# Patient Record
Sex: Female | Born: 1940 | Race: White | Hispanic: No | Smoking: Current every day smoker
Health system: Southern US, Community
[De-identification: ages and names within clinical notes are randomized; demographics above are authoritative.]

## PROBLEM LIST (undated history)

## (undated) DIAGNOSIS — I2510 Atherosclerotic heart disease of native coronary artery without angina pectoris: Secondary | ICD-10-CM

## (undated) DIAGNOSIS — L0390 Cellulitis, unspecified: Secondary | ICD-10-CM

## (undated) DIAGNOSIS — I509 Heart failure, unspecified: Secondary | ICD-10-CM

## (undated) DIAGNOSIS — F4310 Post-traumatic stress disorder, unspecified: Secondary | ICD-10-CM

## (undated) DIAGNOSIS — K279 Peptic ulcer, site unspecified, unspecified as acute or chronic, without hemorrhage or perforation: Secondary | ICD-10-CM

## (undated) DIAGNOSIS — G43909 Migraine, unspecified, not intractable, without status migrainosus: Secondary | ICD-10-CM

## (undated) DIAGNOSIS — F419 Anxiety disorder, unspecified: Secondary | ICD-10-CM

## (undated) DIAGNOSIS — J45909 Unspecified asthma, uncomplicated: Secondary | ICD-10-CM

## (undated) DIAGNOSIS — M069 Rheumatoid arthritis, unspecified: Secondary | ICD-10-CM

## (undated) DIAGNOSIS — I639 Cerebral infarction, unspecified: Secondary | ICD-10-CM

## (undated) DIAGNOSIS — F0390 Unspecified dementia without behavioral disturbance: Secondary | ICD-10-CM

## (undated) DIAGNOSIS — F319 Bipolar disorder, unspecified: Secondary | ICD-10-CM

## (undated) DIAGNOSIS — I10 Essential (primary) hypertension: Secondary | ICD-10-CM

## (undated) HISTORY — DX: Unspecified asthma, uncomplicated: J45.909

## (undated) HISTORY — PX: TONSILLECTOMY: SUR1361

## (undated) HISTORY — PX: BREAST SURGERY: SHX581

## (undated) HISTORY — PX: BREAST ENHANCEMENT SURGERY: SHX7

## (undated) HISTORY — DX: Unspecified dementia, unspecified severity, without behavioral disturbance, psychotic disturbance, mood disturbance, and anxiety: F03.90

## (undated) HISTORY — DX: Cellulitis, unspecified: L03.90

## (undated) HISTORY — DX: Peptic ulcer, site unspecified, unspecified as acute or chronic, without hemorrhage or perforation: K27.9

## (undated) HISTORY — DX: Migraine, unspecified, not intractable, without status migrainosus: G43.909

## (undated) HISTORY — PX: CHOLECYSTECTOMY: SHX55

## (undated) HISTORY — PX: TUBAL LIGATION: SHX77

---

## 2013-07-29 ENCOUNTER — Emergency Department (HOSPITAL_BASED_OUTPATIENT_CLINIC_OR_DEPARTMENT_OTHER)
Admission: EM | Admit: 2013-07-29 | Discharge: 2013-07-29 | Disposition: A | Discharge status: Home / Self Care | Payer: Medicare Other | Attending: Emergency Medicine | Admitting: Emergency Medicine

## 2013-07-29 ENCOUNTER — Emergency Department (HOSPITAL_BASED_OUTPATIENT_CLINIC_OR_DEPARTMENT_OTHER): Payer: Medicare Other

## 2013-07-29 ENCOUNTER — Encounter (HOSPITAL_BASED_OUTPATIENT_CLINIC_OR_DEPARTMENT_OTHER): Payer: Self-pay | Admitting: Emergency Medicine

## 2013-07-29 DIAGNOSIS — I2510 Atherosclerotic heart disease of native coronary artery without angina pectoris: Secondary | ICD-10-CM | POA: Insufficient documentation

## 2013-07-29 DIAGNOSIS — M069 Rheumatoid arthritis, unspecified: Secondary | ICD-10-CM | POA: Insufficient documentation

## 2013-07-29 DIAGNOSIS — Z7902 Long term (current) use of antithrombotics/antiplatelets: Secondary | ICD-10-CM | POA: Insufficient documentation

## 2013-07-29 DIAGNOSIS — Z79899 Other long term (current) drug therapy: Secondary | ICD-10-CM | POA: Insufficient documentation

## 2013-07-29 DIAGNOSIS — Z7982 Long term (current) use of aspirin: Secondary | ICD-10-CM | POA: Insufficient documentation

## 2013-07-29 DIAGNOSIS — R600 Localized edema: Secondary | ICD-10-CM

## 2013-07-29 DIAGNOSIS — Z8673 Personal history of transient ischemic attack (TIA), and cerebral infarction without residual deficits: Secondary | ICD-10-CM | POA: Insufficient documentation

## 2013-07-29 DIAGNOSIS — F17200 Nicotine dependence, unspecified, uncomplicated: Secondary | ICD-10-CM | POA: Insufficient documentation

## 2013-07-29 DIAGNOSIS — I10 Essential (primary) hypertension: Secondary | ICD-10-CM | POA: Insufficient documentation

## 2013-07-29 DIAGNOSIS — R609 Edema, unspecified: Secondary | ICD-10-CM | POA: Insufficient documentation

## 2013-07-29 DIAGNOSIS — I509 Heart failure, unspecified: Secondary | ICD-10-CM | POA: Insufficient documentation

## 2013-07-29 DIAGNOSIS — IMO0002 Reserved for concepts with insufficient information to code with codable children: Secondary | ICD-10-CM | POA: Insufficient documentation

## 2013-07-29 HISTORY — DX: Heart failure, unspecified: I50.9

## 2013-07-29 HISTORY — DX: Rheumatoid arthritis, unspecified: M06.9

## 2013-07-29 HISTORY — DX: Essential (primary) hypertension: I10

## 2013-07-29 HISTORY — DX: Cerebral infarction, unspecified: I63.9

## 2013-07-29 HISTORY — DX: Atherosclerotic heart disease of native coronary artery without angina pectoris: I25.10

## 2013-07-29 LAB — COMPREHENSIVE METABOLIC PANEL
ALBUMIN: 3.8 g/dL (ref 3.5–5.2)
ALK PHOS: 121 U/L — AB (ref 39–117)
ALT: 43 U/L — ABNORMAL HIGH (ref 0–35)
AST: 39 U/L — ABNORMAL HIGH (ref 0–37)
BUN: 10 mg/dL (ref 6–23)
CO2: 29 mEq/L (ref 19–32)
Calcium: 9.4 mg/dL (ref 8.4–10.5)
Chloride: 98 mEq/L (ref 96–112)
Creatinine, Ser: 0.5 mg/dL (ref 0.50–1.10)
GFR calc non Af Amer: >90 mL/min (ref >90)
GLUCOSE: 209 mg/dL — AB (ref 70–99)
POTASSIUM: 4.7 meq/L (ref 3.7–5.3)
SODIUM: 139 meq/L (ref 137–147)
Total Bilirubin: 0.3 mg/dL (ref 0.3–1.2)
Total Protein: 6.5 g/dL (ref 6.0–8.3)

## 2013-07-29 LAB — TROPONIN I: Troponin I: <0.3 ng/mL (ref <0.30)

## 2013-07-29 LAB — CBC
HCT: 35.9 % — ABNORMAL LOW (ref 36.0–46.0)
HEMOGLOBIN: 12 g/dL (ref 12.0–15.0)
MCH: 28.8 pg (ref 26.0–34.0)
MCHC: 33.4 g/dL (ref 30.0–36.0)
MCV: 86.1 fL (ref 78.0–100.0)
Platelets: 203 10*3/uL (ref 150–400)
RBC: 4.17 MIL/uL (ref 3.87–5.11)
RDW: 15.3 % (ref 11.5–15.5)
WBC: 8.8 10*3/uL (ref 4.0–10.5)

## 2013-07-29 LAB — PRO B NATRIURETIC PEPTIDE: Pro B Natriuretic peptide (BNP): 85.9 pg/mL (ref 0–125)

## 2013-07-29 MED ORDER — POTASSIUM CHLORIDE CRYS ER 20 MEQ PO TBCR: 20.0000 meq | EXTENDED_RELEASE_TABLET | Freq: Every day | ORAL | Status: DC

## 2013-07-29 MED ORDER — HYDROCODONE-ACETAMINOPHEN 5-325 MG PO TABS: 2.0000 | ORAL_TABLET | ORAL | Status: DC | PRN

## 2013-07-29 MED ORDER — ONDANSETRON HCL 4 MG/2ML IJ SOLN
4.0000 mg | Freq: Once | INTRAMUSCULAR | Status: AC
  Administered 2013-07-29: 4 mg via INTRAVENOUS

## 2013-07-29 MED ORDER — FUROSEMIDE 10 MG/ML IJ SOLN
40.0000 mg | Freq: Once | INTRAMUSCULAR | Status: AC
  Administered 2013-07-29: 40 mg via INTRAVENOUS
  Filled 2013-07-29: qty 4

## 2013-07-29 MED ORDER — ONDANSETRON HCL 4 MG/2ML IJ SOLN: 4.0000 mg | Freq: Once | INTRAMUSCULAR | Status: DC

## 2013-07-29 MED ORDER — MORPHINE SULFATE 4 MG/ML IJ SOLN
4.0000 mg | Freq: Once | INTRAMUSCULAR | Status: AC
  Administered 2013-07-29: 4 mg via INTRAVENOUS
  Filled 2013-07-29: qty 1

## 2013-07-29 MED ORDER — ONDANSETRON HCL 4 MG/2ML IJ SOLN
INTRAMUSCULAR | Status: AC
  Administered 2013-07-29: 4 mg via INTRAVENOUS
  Filled 2013-07-29: qty 2

## 2013-07-29 MED ORDER — FUROSEMIDE 20 MG PO TABS: 20.0000 mg | ORAL_TABLET | Freq: Every day | ORAL | Status: DC

## 2013-07-29 NOTE — Discharge Instructions (Signed)
Lasix and potassium as prescribed.  Hydrocodone as prescribed as needed for pain.  Followup with your primary Dr. in one week to be rechecked, and return to the ER if your symptoms substantially worsen or change.   Peripheral Edema You have swelling in your legs (peripheral edema). This swelling is due to excess accumulation of salt and water in your body. Edema may be a sign of heart, kidney or liver disease, or a side effect of a medication. It may also be due to problems in the leg veins. Elevating your legs and using special support stockings may be very helpful, if the cause of the swelling is due to poor venous circulation. Avoid long periods of standing, whatever the cause. Treatment of edema depends on identifying the cause. Chips, pretzels, pickles and other salty foods should be avoided. Restricting salt in your diet is almost always needed. Water pills (diuretics) are often used to remove the excess salt and water from your body via urine. These medicines prevent the kidney from reabsorbing sodium. This increases urine flow. Diuretic treatment may also result in lowering of potassium levels in your body. Potassium supplements may be needed if you have to use diuretics daily. Daily weights can help you keep track of your progress in clearing your edema. You should call your caregiver for follow up care as recommended. SEEK IMMEDIATE MEDICAL CARE IF:   You have increased swelling, pain, redness, or heat in your legs.  You develop shortness of breath, especially when lying down.  You develop chest or abdominal pain, weakness, or fainting.  You have a fever. Document Released: 03/21/2004 Document Revised: 05/06/2011 Document Reviewed: 03/01/2009 [facility] Patient Information 2014 [place], [place].

## 2013-07-29 NOTE — ED Notes (Signed)
MD at bedside. 

## 2013-07-29 NOTE — ED Provider Notes (Signed)
CSN: [id number]     Arrival date & time 07/29/13  1243 History   First MD Initiated Contact with Patient 07/29/13 1326     Chief Complaint  Patient presents with  . Leg Swelling     (Consider location/radiation/quality/duration/timing/severity/associated sxs/prior Treatment) HPI Comments: Patient is a 73 year old female with history of hypertension, CHF. She presents today with complaints of increased leg swelling. She states she's been having discomfort in her legs it is making it difficult for her to walk. She has had episodes of shortness of breath but denies this at present. She denies any chest pain. She denies taking any diuretics. She was seen last week by her primary Dr. who felt that she had cellulitis. She had received multiple doses of IV antibiotics. Her redness has improved, however the swelling persists.  The history is provided by the patient.    Past Medical History  Diagnosis Date  . Rheumatoid arthritis   . CHF (congestive heart failure)   . Hypertension   . Stroke   . Coronary artery disease    Past Surgical History  Procedure Laterality Date  . Tubal ligation    . Cholecystectomy    . Breast surgery     No family history on file. History  Substance Use Topics  . Smoking status: Current Every Day Smoker  . Smokeless tobacco: Not on file  . Alcohol Use: Not on file   OB History   Grav Para Term Preterm Abortions TAB SAB Ect Mult Living                 Review of Systems  All other systems reviewed and are negative.     Allergies  Shellfish allergy and Vesicare  Home Medications   Prior to Admission medications   Medication Sig Start Date End Date Taking? Authorizing Provider  ALPRAZolam Prudy Feeler) 0.5 MG tablet Take 0.5 mg by mouth 2 (two) times daily.   Yes Historical Provider, MD  aspirin 81 MG tablet Take 81 mg by mouth daily.   Yes Historical Provider, MD  carvedilol (COREG) 25 MG tablet Take 25 mg by mouth 2 (two) times daily with a meal.    Yes Historical Provider, MD  clopidogrel (PLAVIX) 75 MG tablet Take 75 mg by mouth daily with breakfast.   Yes Historical Provider, MD  gabapentin (NEURONTIN) 100 MG capsule Take 100 mg by mouth 3 (three) times daily.   Yes Historical Provider, MD  ibuprofen (ADVIL,MOTRIN) 800 MG tablet Take 800 mg by mouth every 8 (eight) hours as needed.   Yes Historical Provider, MD  mirabegron ER (MYRBETRIQ) 50 MG TB24 tablet Take 50 mg by mouth daily.   Yes Historical Provider, MD  Multiple Vitamin (MULTIVITAMIN) tablet Take 1 tablet by mouth daily.   Yes Historical Provider, MD  prednisoLONE 5 MG TABS tablet Take 5 mg by mouth daily.   Yes Historical Provider, MD  traZODone (DESYREL) 50 MG tablet Take 50 mg by mouth at bedtime.   Yes Historical Provider, MD   BP 126/61  Pulse 67  Temp(Src) 98.3 F (36.8 C) (Oral)  Resp 16  Ht 5\' 1"  (1.549 m)  Wt 164 lb (74.39 kg)  BMI 31.00 kg/m2  SpO2 97% Physical Exam  Nursing note and vitals reviewed. Constitutional: She is oriented to person, place, and time. She appears well-developed and well-nourished. No distress.  HENT:  Head: Normocephalic and atraumatic.  Neck: Normal range of motion. Neck supple.  Cardiovascular: Normal rate and regular rhythm.  Exam  reveals no gallop and no friction rub.   No murmur heard. Pulmonary/Chest: Effort normal and breath sounds normal. No respiratory distress. She has no wheezes.  Abdominal: Soft. Bowel sounds are normal. She exhibits no distension. There is no tenderness.  Musculoskeletal: Normal range of motion. She exhibits edema.   There is 3+ pitting edema in the bilateral lower extremities in a stocking distribution.  Neurological: She is alert and oriented to person, place, and time.  Skin: Skin is warm and dry. She is not diaphoretic.    ED Course  Procedures (including critical care time) Labs Review Labs Reviewed  CBC  COMPREHENSIVE METABOLIC PANEL  TROPONIN I  PRO B NATRIURETIC PEPTIDE    Imaging  Review No results found.   EKG Interpretation   Date/Time:  Thursday July 29 2013 13:42:25 EDT Ventricular Rate:  68 PR Interval:  186 QRS Duration: 74 QT Interval:  416 QTC Calculation: 442 R Axis:   3 Text Interpretation:  Normal sinus rhythm Normal ECG Confirmed by DELOS   MD, Pauline Pegues ([id number]) on 07/29/2013 2:59:59 PM      MDM   Final diagnoses:  None    Patient is a 73 year old female with history of hypertension, CHF, and rheumatoid arthritis. She presents with complaints of swelling of both legs. She had a recent cellulitis which was treated with antibiotics. The redness has improved, however her swelling persists. Workup reveals a normal BNP and clear chest x-ray. This does not appear to be CHF. Her renal function is normal. She was given IV Lasix with a good diuresis. I feel as though she is appropriate for discharge. She will be treated with Lasix and when necessary followup. She is also requested something for discomfort and I will write her for some hydrocodone.    Geoffery Lyons, MD 07/29/13 [id number]

## 2013-07-29 NOTE — ED Notes (Signed)
Pt c/o bilateral leg swelling and pain.  Seen by provider last Saturday and has been treated for infection to lower legs.

## 2013-08-09 ENCOUNTER — Emergency Department (HOSPITAL_BASED_OUTPATIENT_CLINIC_OR_DEPARTMENT_OTHER): Payer: Medicare Other

## 2013-08-09 ENCOUNTER — Emergency Department (HOSPITAL_BASED_OUTPATIENT_CLINIC_OR_DEPARTMENT_OTHER)
Admission: EM | Admit: 2013-08-09 | Discharge: 2013-08-09 | Disposition: A | Discharge status: Home / Self Care | Payer: Medicare Other | Attending: Emergency Medicine | Admitting: Emergency Medicine

## 2013-08-09 ENCOUNTER — Encounter (HOSPITAL_BASED_OUTPATIENT_CLINIC_OR_DEPARTMENT_OTHER): Payer: Self-pay | Admitting: Emergency Medicine

## 2013-08-09 DIAGNOSIS — F411 Generalized anxiety disorder: Secondary | ICD-10-CM | POA: Insufficient documentation

## 2013-08-09 DIAGNOSIS — Z7982 Long term (current) use of aspirin: Secondary | ICD-10-CM | POA: Insufficient documentation

## 2013-08-09 DIAGNOSIS — M069 Rheumatoid arthritis, unspecified: Secondary | ICD-10-CM | POA: Insufficient documentation

## 2013-08-09 DIAGNOSIS — Z8673 Personal history of transient ischemic attack (TIA), and cerebral infarction without residual deficits: Secondary | ICD-10-CM | POA: Insufficient documentation

## 2013-08-09 DIAGNOSIS — I2510 Atherosclerotic heart disease of native coronary artery without angina pectoris: Secondary | ICD-10-CM | POA: Insufficient documentation

## 2013-08-09 DIAGNOSIS — Z79899 Other long term (current) drug therapy: Secondary | ICD-10-CM | POA: Insufficient documentation

## 2013-08-09 DIAGNOSIS — I509 Heart failure, unspecified: Secondary | ICD-10-CM | POA: Insufficient documentation

## 2013-08-09 DIAGNOSIS — Z7902 Long term (current) use of antithrombotics/antiplatelets: Secondary | ICD-10-CM | POA: Insufficient documentation

## 2013-08-09 DIAGNOSIS — R0602 Shortness of breath: Secondary | ICD-10-CM | POA: Insufficient documentation

## 2013-08-09 DIAGNOSIS — IMO0002 Reserved for concepts with insufficient information to code with codable children: Secondary | ICD-10-CM | POA: Insufficient documentation

## 2013-08-09 DIAGNOSIS — R609 Edema, unspecified: Secondary | ICD-10-CM | POA: Insufficient documentation

## 2013-08-09 DIAGNOSIS — R600 Localized edema: Secondary | ICD-10-CM

## 2013-08-09 DIAGNOSIS — F17200 Nicotine dependence, unspecified, uncomplicated: Secondary | ICD-10-CM | POA: Insufficient documentation

## 2013-08-09 HISTORY — DX: Anxiety disorder, unspecified: F41.9

## 2013-08-09 LAB — BASIC METABOLIC PANEL
BUN: 13 mg/dL (ref 6–23)
CALCIUM: 9.4 mg/dL (ref 8.4–10.5)
CHLORIDE: 98 meq/L (ref 96–112)
CO2: 29 mEq/L (ref 19–32)
CREATININE: 0.6 mg/dL (ref 0.50–1.10)
GFR calc non Af Amer: 88 mL/min — ABNORMAL LOW (ref >90)
Glucose, Bld: 118 mg/dL — ABNORMAL HIGH (ref 70–99)
Potassium: 4 mEq/L (ref 3.7–5.3)
Sodium: 138 mEq/L (ref 137–147)

## 2013-08-09 LAB — URINALYSIS, ROUTINE W REFLEX MICROSCOPIC
Bilirubin Urine: NEGATIVE
Glucose, UA: NEGATIVE mg/dL
HGB URINE DIPSTICK: NEGATIVE
KETONES UR: NEGATIVE mg/dL
Nitrite: NEGATIVE
Protein, ur: NEGATIVE mg/dL
Specific Gravity, Urine: 1.008 (ref 1.005–1.030)
UROBILINOGEN UA: 0.2 mg/dL (ref 0.0–1.0)
pH: 6 (ref 5.0–8.0)

## 2013-08-09 LAB — CBC WITH DIFFERENTIAL/PLATELET
Basophils Absolute: 0 10*3/uL (ref 0.0–0.1)
Basophils Relative: 0 % (ref 0–1)
EOS PCT: 1 % (ref 0–5)
Eosinophils Absolute: 0.1 10*3/uL (ref 0.0–0.7)
HCT: 35.3 % — ABNORMAL LOW (ref 36.0–46.0)
Hemoglobin: 11.7 g/dL — ABNORMAL LOW (ref 12.0–15.0)
LYMPHS ABS: 3.8 10*3/uL (ref 0.7–4.0)
Lymphocytes Relative: 31 % (ref 12–46)
MCH: 28.1 pg (ref 26.0–34.0)
MCHC: 33.1 g/dL (ref 30.0–36.0)
MCV: 84.9 fL (ref 78.0–100.0)
MONO ABS: 1.1 10*3/uL — AB (ref 0.1–1.0)
Monocytes Relative: 9 % (ref 3–12)
Neutro Abs: 7.3 10*3/uL (ref 1.7–7.7)
Neutrophils Relative %: 59 % (ref 43–77)
Platelets: 190 10*3/uL (ref 150–400)
RBC: 4.16 MIL/uL (ref 3.87–5.11)
RDW: 15.5 % (ref 11.5–15.5)
WBC: 12.3 10*3/uL — ABNORMAL HIGH (ref 4.0–10.5)

## 2013-08-09 LAB — D-DIMER, QUANTITATIVE: D-Dimer, Quant: 0.4 ug/mL-FEU (ref 0.00–0.48)

## 2013-08-09 LAB — URINE MICROSCOPIC-ADD ON

## 2013-08-09 LAB — PRO B NATRIURETIC PEPTIDE: Pro B Natriuretic peptide (BNP): 132.4 pg/mL — ABNORMAL HIGH (ref 0–125)

## 2013-08-09 MED ORDER — HYDROCODONE-ACETAMINOPHEN 5-325 MG PO TABS: 1.0000 | ORAL_TABLET | Freq: Four times a day (QID) | ORAL | Status: DC | PRN

## 2013-08-09 MED ORDER — FUROSEMIDE 20 MG PO TABS: 20.0000 mg | ORAL_TABLET | Freq: Two times a day (BID) | ORAL | Status: DC

## 2013-08-09 MED ORDER — FUROSEMIDE 10 MG/ML IJ SOLN
20.0000 mg | Freq: Once | INTRAMUSCULAR | Status: AC
  Administered 2013-08-09: 20 mg via INTRAVENOUS
  Filled 2013-08-09: qty 2

## 2013-08-09 MED ORDER — ONDANSETRON HCL 4 MG/2ML IJ SOLN
4.0000 mg | Freq: Once | INTRAMUSCULAR | Status: AC
  Administered 2013-08-09: 4 mg via INTRAVENOUS
  Filled 2013-08-09: qty 2

## 2013-08-09 MED ORDER — MORPHINE SULFATE 4 MG/ML IJ SOLN
4.0000 mg | Freq: Once | INTRAMUSCULAR | Status: AC
  Administered 2013-08-09: 4 mg via INTRAVENOUS
  Filled 2013-08-09: qty 1

## 2013-08-09 NOTE — Discharge Instructions (Signed)

## 2013-08-09 NOTE — ED Provider Notes (Signed)
CSN: [id number]     Arrival date & time 08/09/13  1455 History  This chart was scribed for Shon Baton, MD by Charline Bills, ED Scribe. The patient was seen in room MH04/MH04. Patient's care was started at 3:19 PM.   Chief Complaint  Patient presents with  . Leg Pain   The history is provided by the patient. No language interpreter was used.   HPI Comments: Joyce Mathews is a 73 y.o. female, with a h/o CHF, who presents to the Emergency Department complaining of constant leg pain, R greater than L. Pt rates her current pain 10/10. Pt reports walking 4 blocks to [employer] this morning before experiencing pain. She reports bilateral leg swelling that began after a bug bite 6 months ago.. Pt states that she was seen 2 weeks ago for same symptoms and given 325mg  Hydrocodone. She currently takes 1 water pill a day; unsure of the dosage.   She denies chest pain but does report occasional shortness of breath. Shortness of breath right now. Denies any coughs or fevers. Was also recently treated for cellulitis. Primary care physician is at [facility].  Past Medical History  Diagnosis Date  . Rheumatoid arthritis   . CHF (congestive heart failure)   . Hypertension   . Stroke   . Coronary artery disease   . Anxiety    Past Surgical History  Procedure Laterality Date  . Tubal ligation    . Cholecystectomy    . Breast surgery     History reviewed. No pertinent family history. History  Substance Use Topics  . Smoking status: Current Every Day Smoker  . Smokeless tobacco: Not on file  . Alcohol Use: Not on file   OB History   Grav Para Term Preterm Abortions TAB SAB Ect Mult Living                 Review of Systems  Constitutional: Negative for fever.  Respiratory: Positive for shortness of breath. Negative for cough and chest tightness.   Cardiovascular: Positive for leg swelling. Negative for chest pain and palpitations.  Gastrointestinal: Negative for nausea, vomiting and  abdominal pain.  Genitourinary: Negative for dysuria.  Musculoskeletal: Negative for back pain.       Bilateral leg pain  Neurological: Negative for headaches.  Psychiatric/Behavioral: Negative for confusion.  All other systems reviewed and are negative.   Allergies  Shellfish allergy and Vesicare  Home Medications   Prior to Admission medications   Medication Sig Start Date End Date Taking? Authorizing Provider  LORazepam (ATIVAN) 0.5 MG tablet Take 0.5 mg by mouth every 8 (eight) hours.   Yes Historical Provider, MD  ALPRAZolam ) 0.5 MG tablet Take 0.5 mg by mouth 2 (two) times daily.    Historical Provider, MD  aspirin 81 MG tablet Take 81 mg by mouth daily.    Historical Provider, MD  carvedilol (COREG) 25 MG tablet Take 25 mg by mouth 2 (two) times daily with a meal.    Historical Provider, MD  clopidogrel (PLAVIX) 75 MG tablet Take 75 mg by mouth daily with breakfast.    Historical Provider, MD  furosemide (LASIX) 20 MG tablet Take 1 tablet (20 mg total) by mouth 2 (two) times daily. 08/09/13   08/11/13, MD  gabapentin (NEURONTIN) 100 MG capsule Take 100 mg by mouth 3 (three) times daily.    Historical Provider, MD  HYDROcodone-acetaminophen (NORCO) 5-325 MG per tablet Take 2 tablets by mouth every 4 (four) hours  as needed. 07/29/13   Geoffery Lyons, MD  HYDROcodone-acetaminophen (NORCO/VICODIN) 5-325 MG per tablet Take 1 tablet by mouth every 6 (six) hours as needed. 08/09/13   Shon Baton, MD  ibuprofen (ADVIL,MOTRIN) 800 MG tablet Take 800 mg by mouth every 8 (eight) hours as needed.    Historical Provider, MD  mirabegron ER (MYRBETRIQ) 50 MG TB24 tablet Take 50 mg by mouth daily.    Historical Provider, MD  Multiple Vitamin (MULTIVITAMIN) tablet Take 1 tablet by mouth daily.    Historical Provider, MD  potassium chloride SA (K-DUR,KLOR-CON) 20 MEQ tablet Take 1 tablet (20 mEq total) by mouth daily. 07/29/13   Geoffery Lyons, MD  prednisoLONE 5 MG TABS tablet Take 5  mg by mouth daily.    Historical Provider, MD  traZODone (DESYREL) 50 MG tablet Take 50 mg by mouth at bedtime.    Historical Provider, MD   Triage Vitals: BP 134/65  Pulse 75  Temp(Src) 97.6 F (36.4 C) (Oral)  Resp 18  Ht 5\' 1"  (1.549 m)  Wt 164 lb (74.39 kg)  BMI 31.00 kg/m2  SpO2 98% Physical Exam  Nursing note and vitals reviewed. Constitutional: She is oriented to person, place, and time. She appears well-developed and well-nourished.  Anxious appearing  HENT:  Head: Normocephalic and atraumatic.  Eyes: Pupils are equal, round, and reactive to light.  Neck: Neck supple. No JVD present.  Cardiovascular: Normal rate, regular rhythm and normal heart sounds.   No murmur heard. Pulmonary/Chest: Effort normal and breath sounds normal. No respiratory distress. She has no wheezes.  Abdominal: Soft. Bowel sounds are normal. There is no tenderness. There is no rebound.  Musculoskeletal: She exhibits edema.  3+ symmetric bilateral lower extremity pitting edema, no overlying skin changes, erythema, or warmth  Neurological: She is alert and oriented to person, place, and time.  Skin: Skin is warm and dry.  Psychiatric: She has a normal mood and affect.    ED Course  Procedures (including critical care time) DIAGNOSTIC STUDIES: Oxygen Saturation is 98% on RA, normal by my interpretation.    COORDINATION OF CARE: 3:22 PM Discussed treatment plan with pt at bedside and pt agreed to plan.\  Labs Review Labs Reviewed  URINALYSIS, ROUTINE W REFLEX MICROSCOPIC - Abnormal; Notable for the following:    Leukocytes, UA MODERATE (*)    All other components within normal limits  CBC WITH DIFFERENTIAL - Abnormal; Notable for the following:    WBC 12.3 (*)    Hemoglobin 11.7 (*)    HCT 35.3 (*)    Monocytes Absolute 1.1 (*)    All other components within normal limits  PRO B NATRIURETIC PEPTIDE - Abnormal; Notable for the following:    Pro B Natriuretic peptide (BNP) 132.4 (*)    All  other components within normal limits  BASIC METABOLIC PANEL - Abnormal; Notable for the following:    Glucose, Bld 118 (*)    GFR calc non Af Amer 88 (*)    All other components within normal limits  URINE CULTURE  D-DIMER, QUANTITATIVE  URINE MICROSCOPIC-ADD ON  CBC WITH DIFFERENTIAL   Imaging Review Dg Chest 2 View  08/09/2013   CLINICAL DATA:  Leg pain.  EXAM: CHEST  2 VIEW  COMPARISON:  July 29, 2013.  FINDINGS: The heart size and mediastinal contours are within normal limits. Both lungs are clear. No pneumothorax or pleural effusion is noted. The visualized skeletal structures are unremarkable.  IMPRESSION: No acute cardiopulmonary abnormality seen.  Electronically Signed   By: Roque Lias M.D.   On: 08/09/2013 16:10    EKG Interpretation   Date/Time:  Monday August 09 2013 16:21:54 EDT Ventricular Rate:  64 PR Interval:  198 QRS Duration: 76 QT Interval:  420 QTC Calculation: 433 R Axis:   11 Text Interpretation:  Normal sinus rhythm Normal ECG Confirmed by Ugonna Keirsey   MD, Wilberth Damon ([id number]) on 08/09/2013 4:35:55 PM      MDM   Final diagnoses:  Lower extremity edema    Patient presents with bilateral lower extremity swelling and pain. Reports similar symptoms when she was seen several weeks ago. She also reports occasional shortness of breath. Exam is notable for extensive bilateral lower extremity edema that is symmetric. Low suspicion for DVT at this time and dimer is negative. Chest x-ray is clear and BNP is only minimally elevated. Other workup is unremarkable. Patient was given 20 mg of IV Lasix with good urine output. Discussed with patient increasing Lasix to 20 mg twice a day. She does have a primary physician and was encouraged to followup with him in 2 weeks for recheck of electrolytes and edema.  After history, exam, and medical workup I feel the patient has been appropriately medically screened and is safe for discharge home. Pertinent diagnoses were discussed with  the patient. Patient was given return precautions.   I personally performed the services described in this documentation, which was scribed in my presence. The recorded information has been reviewed and is accurate.    Shon Baton, MD 08/09/13 Rickey Primus

## 2013-08-09 NOTE — ED Notes (Signed)
Pt assisted with attempting to call her son, her neighbor and her other son, no answers. Pt given cup of coffee per request, assisted to check out desk, then to waiting room. Denean, registration, sitting by pt. Informed that pt will continue to attempt to call family members for a ride home. Security also alerted to pt awaiting ride home.

## 2013-08-09 NOTE — ED Notes (Signed)
Pt is in radiology.

## 2013-08-09 NOTE — ED Notes (Signed)
Pt to room 4 by ems, pt is awake and alert, alternates between crying hysterically and laughing with staff. Pt reports she has been bitten by "bugs" at her home, and her legs have been swollen x 3 weeks. She walked 4 blocks to the store this morning, when she got home she had increased pain and swelling to her legs.

## 2013-08-09 NOTE — ED Notes (Signed)
Patient given warm blanket.

## 2013-08-11 LAB — URINE CULTURE

## 2013-08-16 ENCOUNTER — Emergency Department (HOSPITAL_COMMUNITY)
Admission: EM | Admit: 2013-08-16 | Discharge: 2013-08-19 | Disposition: A | Discharge status: Home / Self Care | Payer: Medicare Other | Attending: Emergency Medicine | Admitting: Emergency Medicine

## 2013-08-16 ENCOUNTER — Emergency Department (HOSPITAL_COMMUNITY): Payer: Medicare Other

## 2013-08-16 ENCOUNTER — Encounter (HOSPITAL_COMMUNITY): Payer: Self-pay | Admitting: Emergency Medicine

## 2013-08-16 DIAGNOSIS — Z79899 Other long term (current) drug therapy: Secondary | ICD-10-CM | POA: Insufficient documentation

## 2013-08-16 DIAGNOSIS — IMO0002 Reserved for concepts with insufficient information to code with codable children: Secondary | ICD-10-CM | POA: Insufficient documentation

## 2013-08-16 DIAGNOSIS — F17200 Nicotine dependence, unspecified, uncomplicated: Secondary | ICD-10-CM | POA: Insufficient documentation

## 2013-08-16 DIAGNOSIS — I509 Heart failure, unspecified: Secondary | ICD-10-CM | POA: Insufficient documentation

## 2013-08-16 DIAGNOSIS — Z7902 Long term (current) use of antithrombotics/antiplatelets: Secondary | ICD-10-CM | POA: Insufficient documentation

## 2013-08-16 DIAGNOSIS — Z7982 Long term (current) use of aspirin: Secondary | ICD-10-CM | POA: Insufficient documentation

## 2013-08-16 DIAGNOSIS — R45851 Suicidal ideations: Secondary | ICD-10-CM | POA: Insufficient documentation

## 2013-08-16 DIAGNOSIS — Z8673 Personal history of transient ischemic attack (TIA), and cerebral infarction without residual deficits: Secondary | ICD-10-CM | POA: Insufficient documentation

## 2013-08-16 DIAGNOSIS — I2510 Atherosclerotic heart disease of native coronary artery without angina pectoris: Secondary | ICD-10-CM | POA: Insufficient documentation

## 2013-08-16 DIAGNOSIS — F911 Conduct disorder, childhood-onset type: Secondary | ICD-10-CM | POA: Insufficient documentation

## 2013-08-16 DIAGNOSIS — F411 Generalized anxiety disorder: Secondary | ICD-10-CM | POA: Insufficient documentation

## 2013-08-16 DIAGNOSIS — I10 Essential (primary) hypertension: Secondary | ICD-10-CM | POA: Insufficient documentation

## 2013-08-16 DIAGNOSIS — F332 Major depressive disorder, recurrent severe without psychotic features: Secondary | ICD-10-CM

## 2013-08-16 DIAGNOSIS — M069 Rheumatoid arthritis, unspecified: Secondary | ICD-10-CM | POA: Insufficient documentation

## 2013-08-16 LAB — CBC
HCT: 38.2 % (ref 36.0–46.0)
Hemoglobin: 12.5 g/dL (ref 12.0–15.0)
MCH: 27.7 pg (ref 26.0–34.0)
MCHC: 32.7 g/dL (ref 30.0–36.0)
MCV: 84.5 fL (ref 78.0–100.0)
PLATELETS: 208 10*3/uL (ref 150–400)
RBC: 4.52 MIL/uL (ref 3.87–5.11)
RDW: 15.9 % — AB (ref 11.5–15.5)
WBC: 8.8 10*3/uL (ref 4.0–10.5)

## 2013-08-16 LAB — URINE MICROSCOPIC-ADD ON

## 2013-08-16 LAB — ETHANOL: Alcohol, Ethyl (B): <11 mg/dL (ref 0–11)

## 2013-08-16 LAB — COMPREHENSIVE METABOLIC PANEL
ALBUMIN: 4 g/dL (ref 3.5–5.2)
ALT: 27 U/L (ref 0–35)
AST: 29 U/L (ref 0–37)
Alkaline Phosphatase: 109 U/L (ref 39–117)
BUN: 18 mg/dL (ref 6–23)
CO2: 27 mEq/L (ref 19–32)
CREATININE: 0.79 mg/dL (ref 0.50–1.10)
Calcium: 9.8 mg/dL (ref 8.4–10.5)
Chloride: 98 mEq/L (ref 96–112)
GFR calc Af Amer: >90 mL/min (ref >90)
GFR calc non Af Amer: 80 mL/min — ABNORMAL LOW (ref >90)
Glucose, Bld: 122 mg/dL — ABNORMAL HIGH (ref 70–99)
Potassium: 4.1 mEq/L (ref 3.7–5.3)
Sodium: 140 mEq/L (ref 137–147)
TOTAL PROTEIN: 7 g/dL (ref 6.0–8.3)
Total Bilirubin: 0.4 mg/dL (ref 0.3–1.2)

## 2013-08-16 LAB — RAPID URINE DRUG SCREEN, HOSP PERFORMED
Amphetamines: NOT DETECTED
Barbiturates: NOT DETECTED
Benzodiazepines: NOT DETECTED
COCAINE: NOT DETECTED
OPIATES: NOT DETECTED
Tetrahydrocannabinol: NOT DETECTED

## 2013-08-16 LAB — URINALYSIS, ROUTINE W REFLEX MICROSCOPIC
Bilirubin Urine: NEGATIVE
GLUCOSE, UA: NEGATIVE mg/dL
HGB URINE DIPSTICK: NEGATIVE
KETONES UR: NEGATIVE mg/dL
Nitrite: NEGATIVE
Protein, ur: NEGATIVE mg/dL
Specific Gravity, Urine: 1.012 (ref 1.005–1.030)
Urobilinogen, UA: 0.2 mg/dL (ref 0.0–1.0)
pH: 5 (ref 5.0–8.0)

## 2013-08-16 LAB — ACETAMINOPHEN LEVEL: Acetaminophen (Tylenol), Serum: <15 ug/mL (ref 10–30)

## 2013-08-16 LAB — PRO B NATRIURETIC PEPTIDE: PRO B NATRI PEPTIDE: 181.1 pg/mL — AB (ref 0–125)

## 2013-08-16 LAB — SALICYLATE LEVEL

## 2013-08-16 MED ORDER — DONEPEZIL HCL 10 MG PO TABS
10.0000 mg | ORAL_TABLET | Freq: Every day | ORAL | Status: DC
  Administered 2013-08-16 – 2013-08-18 (×3): 10 mg via ORAL
  Filled 2013-08-16 (×4): qty 1

## 2013-08-16 MED ORDER — TRAZODONE HCL 100 MG PO TABS
100.0000 mg | ORAL_TABLET | Freq: Every day | ORAL | Status: DC
  Administered 2013-08-16 – 2013-08-18 (×3): 100 mg via ORAL
  Filled 2013-08-16 (×3): qty 1

## 2013-08-16 MED ORDER — ONDANSETRON HCL 4 MG PO TABS
4.0000 mg | ORAL_TABLET | Freq: Three times a day (TID) | ORAL | Status: DC | PRN
  Administered 2013-08-17: 4 mg via ORAL
  Filled 2013-08-16: qty 1

## 2013-08-16 MED ORDER — FUROSEMIDE 40 MG PO TABS
20.0000 mg | ORAL_TABLET | Freq: Two times a day (BID) | ORAL | Status: DC
  Administered 2013-08-16 – 2013-08-19 (×6): 20 mg via ORAL
  Filled 2013-08-16 (×7): qty 1

## 2013-08-16 MED ORDER — ASPIRIN 81 MG PO TABS: 81.0000 mg | ORAL_TABLET | Freq: Every day | ORAL | Status: DC

## 2013-08-16 MED ORDER — ALBUTEROL SULFATE (2.5 MG/3ML) 0.083% IN NEBU: 2.5000 mg | INHALATION_SOLUTION | RESPIRATORY_TRACT | Status: DC | PRN

## 2013-08-16 MED ORDER — PREDNISOLONE 5 MG PO TABS
5.0000 mg | ORAL_TABLET | Freq: Every day | ORAL | Status: DC
  Administered 2013-08-16 – 2013-08-19 (×4): 5 mg via ORAL
  Filled 2013-08-16 (×7): qty 1

## 2013-08-16 MED ORDER — BUDESONIDE-FORMOTEROL FUMARATE 160-4.5 MCG/ACT IN AERO
2.0000 | INHALATION_SPRAY | Freq: Two times a day (BID) | RESPIRATORY_TRACT | Status: DC
  Administered 2013-08-16 – 2013-08-19 (×6): 2 via RESPIRATORY_TRACT
  Filled 2013-08-16 (×2): qty 6

## 2013-08-16 MED ORDER — ONE-DAILY MULTI VITAMINS PO TABS: 1.0000 | ORAL_TABLET | Freq: Every day | ORAL | Status: DC

## 2013-08-16 MED ORDER — CLOPIDOGREL BISULFATE 75 MG PO TABS
75.0000 mg | ORAL_TABLET | Freq: Every day | ORAL | Status: DC
  Administered 2013-08-17 – 2013-08-19 (×3): 75 mg via ORAL
  Filled 2013-08-16 (×6): qty 1

## 2013-08-16 MED ORDER — ACETAMINOPHEN 325 MG PO TABS
650.0000 mg | ORAL_TABLET | ORAL | Status: DC | PRN
  Administered 2013-08-17 – 2013-08-19 (×2): 650 mg via ORAL
  Filled 2013-08-16 (×3): qty 2

## 2013-08-16 MED ORDER — ALBUTEROL SULFATE HFA 108 (90 BASE) MCG/ACT IN AERS: 1.0000 | INHALATION_SPRAY | RESPIRATORY_TRACT | Status: DC | PRN

## 2013-08-16 MED ORDER — CARVEDILOL 25 MG PO TABS
25.0000 mg | ORAL_TABLET | Freq: Two times a day (BID) | ORAL | Status: DC
  Administered 2013-08-16 – 2013-08-19 (×7): 25 mg via ORAL
  Filled 2013-08-16 (×9): qty 1

## 2013-08-16 MED ORDER — GABAPENTIN 100 MG PO CAPS
100.0000 mg | ORAL_CAPSULE | Freq: Three times a day (TID) | ORAL | Status: DC
  Administered 2013-08-16 – 2013-08-19 (×9): 100 mg via ORAL
  Filled 2013-08-16 (×13): qty 1

## 2013-08-16 MED ORDER — SPIRONOLACTONE 25 MG PO TABS
25.0000 mg | ORAL_TABLET | Freq: Every day | ORAL | Status: DC
  Administered 2013-08-17 – 2013-08-19 (×3): 25 mg via ORAL
  Filled 2013-08-16 (×5): qty 1

## 2013-08-16 MED ORDER — ADULT MULTIVITAMIN W/MINERALS CH
1.0000 | ORAL_TABLET | Freq: Every day | ORAL | Status: DC
  Administered 2013-08-16 – 2013-08-19 (×4): 1 via ORAL
  Filled 2013-08-16 (×4): qty 1

## 2013-08-16 MED ORDER — ASPIRIN EC 81 MG PO TBEC
81.0000 mg | DELAYED_RELEASE_TABLET | Freq: Every day | ORAL | Status: DC
  Administered 2013-08-16 – 2013-08-19 (×4): 81 mg via ORAL
  Filled 2013-08-16 (×6): qty 1

## 2013-08-16 MED ORDER — NICOTINE 21 MG/24HR TD PT24
21.0000 mg | MEDICATED_PATCH | Freq: Every day | TRANSDERMAL | Status: DC
  Administered 2013-08-16 – 2013-08-19 (×4): 21 mg via TRANSDERMAL
  Filled 2013-08-16 (×3): qty 1
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
## 2013-08-16 NOTE — ED Notes (Signed)
Patient transported to X-ray 

## 2013-08-16 NOTE — BHH Counselor (Addendum)
Daughter [name] asks to be kept up to date with pt disposition. # is in [facility] Assessment.  Pt signed consent to release info - placed in pt's chart.  Evette Cristal, [place] Assessment Counselor

## 2013-08-16 NOTE — ED Notes (Signed)
Provided pt with Decaf coffee

## 2013-08-16 NOTE — ED Notes (Signed)
Pt has been wanded.   Pt has 4 bags of belongings in [place] [place] [address]

## 2013-08-17 ENCOUNTER — Encounter (HOSPITAL_COMMUNITY): Payer: Self-pay | Admitting: Psychiatry

## 2013-08-17 DIAGNOSIS — F411 Generalized anxiety disorder: Secondary | ICD-10-CM

## 2013-08-17 DIAGNOSIS — F332 Major depressive disorder, recurrent severe without psychotic features: Secondary | ICD-10-CM

## 2013-08-17 DIAGNOSIS — R45851 Suicidal ideations: Secondary | ICD-10-CM

## 2013-08-17 DIAGNOSIS — R45850 Homicidal ideations: Secondary | ICD-10-CM

## 2013-08-17 MED ORDER — CEPHALEXIN 500 MG PO CAPS
500.0000 mg | ORAL_CAPSULE | Freq: Three times a day (TID) | ORAL | Status: DC
Start: 2013-08-17 — End: 2013-08-19
  Administered 2013-08-17 – 2013-08-19 (×7): 500 mg via ORAL
  Filled 2013-08-17 (×7): qty 1

## 2013-08-17 MED ORDER — PANTOPRAZOLE SODIUM 40 MG PO TBEC
80.0000 mg | DELAYED_RELEASE_TABLET | Freq: Every day | ORAL | Status: DC
  Administered 2013-08-17 – 2013-08-18 (×2): 80 mg via ORAL
  Administered 2013-08-19: 40 mg via ORAL
  Filled 2013-08-17 (×3): qty 2

## 2013-08-17 NOTE — ED Notes (Signed)
pts belongings in locker #26

## 2013-08-17 NOTE — BH Assessment (Signed)
Patient referred to the following Geri Psych facilities:[facility], [facility], [facility], [facility], [facility], [facility], and [facility] [address].

## 2013-08-17 NOTE — Treatment Plan (Signed)
Pt is appropriate for gero-psych placement,
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
## 2013-08-17 NOTE — ED Notes (Signed)
Bed: RC16 Expected date:  Expected time:  Means of arrival:  Comments: TCU patient

## 2013-08-18 MED ORDER — HYDROXYZINE HCL 10 MG PO TABS
10.0000 mg | ORAL_TABLET | Freq: Once | ORAL | Status: AC
  Administered 2013-08-18: 10 mg via ORAL
  Filled 2013-08-18: qty 1

## 2013-08-18 NOTE — Consult Note (Signed)
Review of Systems   Constitutional: Negative.    HENT: Negative.    Eyes: Negative.    Respiratory: Negative.    Cardiovascular: Negative.    Gastrointestinal: Negative.    Genitourinary: Negative.    Musculoskeletal: Negative.    Skin: Negative.    Neurological: Negative.    Endo/Heme/Allergies: Negative.    Psychiatric/Behavioral: Positive for depression.
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
## 2013-08-18 NOTE — Progress Notes (Signed)
CSW called and spoke with Annabelle Harman intake nurse with [facility].  Annabelle Harman reports that the patient meets criteria for admission but they do not have a bed at this time.  She reports that they should have a discharge in the morning so call back to check for bed availability.  According to Annabelle Harman the patient will be accepted pending bed availability in the morning.      Maryelizabeth Rowan, MSW, [place], 08/18/2013 Evening Clinical Social Worker [phone]

## 2013-08-18 NOTE — ED Notes (Signed)
Pt sleeping. Rise and fall of chest. Sitter at bedside.

## 2013-08-18 NOTE — ED Notes (Signed)
Legs elevated per pt request.

## 2013-08-18 NOTE — Progress Notes (Addendum)
Per discussion with psychiatrist, patient to be observed overnight and pt daughter to coming tomorrow morning. Pt anticipated to be dc tomorrow with daughter. Pt daughter to be here at 10 am  Byrd Hesselbach [phone]  ED CSW 08/18/2013 1342pm

## 2013-08-19 DIAGNOSIS — F329 Major depressive disorder, single episode, unspecified: Secondary | ICD-10-CM

## 2013-08-19 DIAGNOSIS — F3289 Other specified depressive episodes: Secondary | ICD-10-CM

## 2013-08-19 MED ORDER — ALPRAZOLAM 0.5 MG PO TABS
0.5000 mg | ORAL_TABLET | Freq: Two times a day (BID) | ORAL | Status: DC
  Administered 2013-08-19: 0.5 mg via ORAL
  Filled 2013-08-19: qty 1

## 2013-08-19 NOTE — Consult Note (Signed)
  Review of Systems  Constitutional: Negative.   HENT: Negative.   Eyes: Negative.   Respiratory: Negative.   Cardiovascular: Negative.   Gastrointestinal: Positive for abdominal pain.  Genitourinary: Negative.   Musculoskeletal: Negative.   Skin: Negative.   Neurological: Negative.   Endo/Heme/Allergies: Negative.   Psychiatric/Behavioral: Positive for depression.

## 2013-08-19 NOTE — BHH Counselor (Addendum)
TC to [facility] admitting RN at [facility]. She states that pt does have a bed at their facility. She sts that she is waiting for a patient to be discharged from [facility] reserved bed. Annabelle Harman says she will call TTS/CSW when bed is available. Annabelle Harman reports most discharges occur by mid afternoon.   Evette Cristal, [place] Assessment Counselor

## 2013-08-19 NOTE — Progress Notes (Signed)
CSW called the daughters of the patient and spoke Desma Maxim and Alexia Freestone to inform them of the transfer to [facility].  The Daughter Desma Maxim reports that she has the contact information to call [facility].     Maryelizabeth Rowan, MSW, [place], 08/19/2013 Evening Clinical Social Worker [phone]

## 2013-08-19 NOTE — BHH Counselor (Signed)
TC to daughter [name] [phone] notifying her of pt's transport to [place] later today. Lavera Guise for update and sts she will visit pt tomorrow at [facility] if pt agreeable to visitors.  Evette Cristal, [place] Assessment Counselor

## 2013-08-19 NOTE — ED Notes (Signed)
Pt sitting up eating a snack

## 2013-08-19 NOTE — Progress Notes (Signed)
CSW spoke with Annabelle Harman with [facility] accepting this patient per Dr. Eliott Nine. Report# [phone].  The facility is ready for the patient to be transported at this time.  CSW informed nursing staff of the updated disposition.  CSW called the sheriff department spoke with Cyndy Freeze and scheduled transportation.  He reports as soon as he can get an Technical sales engineer available they will call [phone] to speak with Fleet Contras, RN patient's nurse.     Maryelizabeth Rowan, MSW, [place], 08/19/2013 Evening Clinical Social Worker [phone]
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
## 2013-08-19 NOTE — ED Notes (Signed)
Patient resting in position of comfort with eyes closed RR WNL--even and unlabored with equal rise and fall of chest Patient in NAD Side rails up, call bell in reach and sitter at bedside

## 2013-08-19 NOTE — ED Notes (Signed)
Social work at bedside.  
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
## 2013-08-19 NOTE — ED Notes (Signed)
Assumed care of patient Patient awake, sitting in doorway talking to sitter  Patient denies complaints or needs at this time

## 2013-08-19 NOTE — ED Notes (Signed)
Joyce Mathews from [facility] reports they are awaiting discharges in the morning for female beds. She also reports they will call our social worker and arrange transport for patients.

## 2014-10-06 ENCOUNTER — Emergency Department (HOSPITAL_BASED_OUTPATIENT_CLINIC_OR_DEPARTMENT_OTHER)
Admission: EM | Admit: 2014-10-06 | Discharge: 2014-10-07 | Disposition: A | Discharge status: Other Acute Inpatient Hospital | Payer: Medicare Other | Attending: Emergency Medicine | Admitting: Emergency Medicine

## 2014-10-06 ENCOUNTER — Encounter (HOSPITAL_BASED_OUTPATIENT_CLINIC_OR_DEPARTMENT_OTHER): Payer: Self-pay | Admitting: Emergency Medicine

## 2014-10-06 DIAGNOSIS — F1310 Sedative, hypnotic or anxiolytic abuse, uncomplicated: Secondary | ICD-10-CM | POA: Diagnosis not present

## 2014-10-06 DIAGNOSIS — F419 Anxiety disorder, unspecified: Secondary | ICD-10-CM | POA: Diagnosis present

## 2014-10-06 DIAGNOSIS — Z7982 Long term (current) use of aspirin: Secondary | ICD-10-CM | POA: Insufficient documentation

## 2014-10-06 DIAGNOSIS — Z7951 Long term (current) use of inhaled steroids: Secondary | ICD-10-CM | POA: Diagnosis not present

## 2014-10-06 DIAGNOSIS — F1110 Opioid abuse, uncomplicated: Secondary | ICD-10-CM | POA: Diagnosis not present

## 2014-10-06 DIAGNOSIS — Z79899 Other long term (current) drug therapy: Secondary | ICD-10-CM | POA: Insufficient documentation

## 2014-10-06 DIAGNOSIS — R600 Localized edema: Secondary | ICD-10-CM | POA: Diagnosis not present

## 2014-10-06 DIAGNOSIS — I10 Essential (primary) hypertension: Secondary | ICD-10-CM | POA: Diagnosis not present

## 2014-10-06 DIAGNOSIS — R609 Edema, unspecified: Secondary | ICD-10-CM

## 2014-10-06 DIAGNOSIS — F29 Unspecified psychosis not due to a substance or known physiological condition: Secondary | ICD-10-CM

## 2014-10-06 DIAGNOSIS — I509 Heart failure, unspecified: Secondary | ICD-10-CM | POA: Diagnosis not present

## 2014-10-06 DIAGNOSIS — I2510 Atherosclerotic heart disease of native coronary artery without angina pectoris: Secondary | ICD-10-CM | POA: Insufficient documentation

## 2014-10-06 DIAGNOSIS — Z8673 Personal history of transient ischemic attack (TIA), and cerebral infarction without residual deficits: Secondary | ICD-10-CM | POA: Diagnosis not present

## 2014-10-06 DIAGNOSIS — Z720 Tobacco use: Secondary | ICD-10-CM | POA: Diagnosis not present

## 2014-10-06 DIAGNOSIS — M069 Rheumatoid arthritis, unspecified: Secondary | ICD-10-CM | POA: Diagnosis not present

## 2014-10-06 HISTORY — DX: Post-traumatic stress disorder, unspecified: F43.10

## 2014-10-06 HISTORY — DX: Bipolar disorder, unspecified: F31.9

## 2014-10-06 HISTORY — DX: Heart failure, unspecified: I50.9

## 2014-10-06 NOTE — ED Notes (Signed)
Pt upset with family and feels like she aggressive, states she got upset with her family tonight and feels like she is not thinking right

## 2014-10-06 NOTE — ED Notes (Signed)
Diarrhea x 1 hour, weakness, nausea, story changes as she tells it. Pt upset over family issues. Currently alert and oriented and stable

## 2014-10-07 ENCOUNTER — Emergency Department (HOSPITAL_BASED_OUTPATIENT_CLINIC_OR_DEPARTMENT_OTHER): Payer: Medicare Other

## 2014-10-07 ENCOUNTER — Encounter (HOSPITAL_BASED_OUTPATIENT_CLINIC_OR_DEPARTMENT_OTHER): Payer: Self-pay | Admitting: *Deleted

## 2014-10-07 LAB — BASIC METABOLIC PANEL
Anion gap: 7 (ref 5–15)
BUN: 14 mg/dL (ref 6–20)
CO2: 30 mmol/L (ref 22–32)
Calcium: 8.8 mg/dL — ABNORMAL LOW (ref 8.9–10.3)
Chloride: 103 mmol/L (ref 101–111)
Creatinine, Ser: 0.57 mg/dL (ref 0.44–1.00)
GFR calc Af Amer: >60 mL/min (ref >60)
GFR calc non Af Amer: >60 mL/min (ref >60)
GLUCOSE: 93 mg/dL (ref 65–99)
Potassium: 3.7 mmol/L (ref 3.5–5.1)
SODIUM: 140 mmol/L (ref 135–145)

## 2014-10-07 LAB — RAPID URINE DRUG SCREEN, HOSP PERFORMED
Amphetamines: NOT DETECTED
BARBITURATES: NOT DETECTED
Benzodiazepines: POSITIVE — AB
Cocaine: NOT DETECTED
Opiates: POSITIVE — AB
Tetrahydrocannabinol: NOT DETECTED

## 2014-10-07 LAB — URINALYSIS, ROUTINE W REFLEX MICROSCOPIC
Bilirubin Urine: NEGATIVE
GLUCOSE, UA: NEGATIVE mg/dL
Hgb urine dipstick: NEGATIVE
KETONES UR: NEGATIVE mg/dL
Nitrite: NEGATIVE
PROTEIN: NEGATIVE mg/dL
Specific Gravity, Urine: 1.014 (ref 1.005–1.030)
Urobilinogen, UA: 0.2 mg/dL (ref 0.0–1.0)
pH: 6.5 (ref 5.0–8.0)

## 2014-10-07 LAB — BRAIN NATRIURETIC PEPTIDE: B Natriuretic Peptide: 32.8 pg/mL (ref 0.0–100.0)

## 2014-10-07 LAB — URINE MICROSCOPIC-ADD ON

## 2014-10-07 LAB — CBC
HCT: 34.8 % — ABNORMAL LOW (ref 36.0–46.0)
Hemoglobin: 11.2 g/dL — ABNORMAL LOW (ref 12.0–15.0)
MCH: 27.5 pg (ref 26.0–34.0)
MCHC: 32.2 g/dL (ref 30.0–36.0)
MCV: 85.3 fL (ref 78.0–100.0)
Platelets: 179 10*3/uL (ref 150–400)
RBC: 4.08 MIL/uL (ref 3.87–5.11)
RDW: 15 % (ref 11.5–15.5)
WBC: 8.3 10*3/uL (ref 4.0–10.5)

## 2014-10-07 LAB — ETHANOL

## 2014-10-07 LAB — D-DIMER, QUANTITATIVE (NOT AT ARMC): D-Dimer, Quant: 0.32 ug/mL-FEU (ref 0.00–0.48)

## 2014-10-07 MED ORDER — DONEPEZIL HCL 10 MG PO TABS
10.0000 mg | ORAL_TABLET | Freq: Every day | ORAL | Status: DC
  Filled 2014-10-07: qty 1

## 2014-10-07 MED ORDER — SUCRALFATE 1 G PO TABS: 1.0000 g | ORAL_TABLET | Freq: Three times a day (TID) | ORAL | Status: DC

## 2014-10-07 MED ORDER — CARVEDILOL 25 MG PO TABS
25.0000 mg | ORAL_TABLET | Freq: Two times a day (BID) | ORAL | Status: DC
  Filled 2014-10-07: qty 1

## 2014-10-07 MED ORDER — GABAPENTIN 100 MG PO CAPS: 100.0000 mg | ORAL_CAPSULE | Freq: Three times a day (TID) | ORAL | Status: DC

## 2014-10-07 MED ORDER — LORAZEPAM 1 MG PO TABS
1.0000 mg | ORAL_TABLET | Freq: Once | ORAL | Status: AC
  Administered 2014-10-07: 1 mg via ORAL
  Filled 2014-10-07: qty 1

## 2014-10-07 MED ORDER — PREDNISOLONE 5 MG PO TABS
5.0000 mg | ORAL_TABLET | Freq: Every day | ORAL | Status: DC
  Filled 2014-10-07: qty 1

## 2014-10-07 MED ORDER — ASPIRIN 81 MG PO TABS: 81.0000 mg | ORAL_TABLET | Freq: Every day | ORAL | Status: DC

## 2014-10-07 NOTE — ED Notes (Signed)
Report called to  RN at [facility].   Pt being transported by [facility]  All meds and valuables transported with pt.   Pt had talked with daughter-in-law earlier and made her aware of admission but not of location

## 2014-10-07 NOTE — ED Notes (Signed)
Cereal provided per pt request as well as coffee.
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
## 2014-10-07 NOTE — BH Assessment (Signed)
Tele Assessment Note   Joyce Mathews is a 74 y.o. female who initially presents to [facility] with weakness, nausea and diarrhea, however pt told medical staff that she was upset due to family problems.  Pt is very tearful and at times she is crying during the interview and has pressured and loud speech.  Pt states she received bad news about her younger sister and she has been upset since that time.  Pt received the news 2 wks ago.  Pt also states she is having trouble with her children, she feels they don't love her and they bring up past issues that are hurtful.  Pt denies SI/HI/AVH but states she asked GOD to take her home because her family doesn't love her.  Pt told this Clinical research associate that she wants to stop taking her medications because she feels that the meds are worsening her anxiety.    Per medical staff: pt was yelling and screaming and emotionally labile.  Her speech is circumstantial. Pt stated that her family physician is trying "kill her" because of the many medications she is prescribed.     Axis I: Major Depression, Recurrent severe Axis II: Deferred Axis III:  Past Medical History  Diagnosis Date  . Rheumatoid arthritis   . CHF (congestive heart failure)   . Hypertension   . Stroke   . Coronary artery disease   . Anxiety   . Bipolar 1 disorder   . PTSD (post-traumatic stress disorder)   . Congestive heart failure   . CHF (congestive heart failure)    Axis IV: other psychosocial or environmental problems, problems related to social environment and problems with primary support group Axis V: 41-50 serious symptoms  Past Medical History:  Past Medical History  Diagnosis Date  . Rheumatoid arthritis   . CHF (congestive heart failure)   . Hypertension   . Stroke   . Coronary artery disease   . Anxiety     Past Surgical History  Procedure Laterality Date  . Tubal ligation    . Cholecystectomy    . Breast surgery      Family History: History reviewed. No pertinent family  history.  Social History:  reports that she has been smoking Cigarettes.  She has been smoking about 1.00 pack per day. She does not have any smokeless tobacco history on file. She reports that she does not drink alcohol or use illicit drugs.  Additional Social History:  Alcohol / Drug Use Pain Medications: See MAR  Prescriptions: See MAR  Over the Counter: See MAR  History of alcohol / drug use?: No history of alcohol / drug abuse Longest period of sobriety (when/how long): None   CIWA: CIWA-Ar BP: 186/78 mmHg Pulse Rate: 78 COWS:    PATIENT STRENGTHS: (choose at least two) Communication skills  Allergies:  Allergies  Allergen Reactions  . Shellfish Allergy   . Vesicare [Solifenacin]     Unknown.    . Yellow Jacket Venom [Bee Venom]     Paralysis, loss of speech, swelling.      Home Medications:  (Not in a hospital admission)  OB/GYN Status:  No LMP recorded. Patient is postmenopausal.  General Assessment Data Location of Assessment: [facility] ([facility] ) TTS Assessment: In system Is this a Tele or Face-to-Face Assessment?: Tele Assessment Is this an Initial Assessment or a Re-assessment for this encounter?: Initial Assessment Marital status: Widowed Joyce Mathews: Joyce Mathews  Is patient pregnant?: No Pregnancy Status: No Living Arrangements: Alone, Other (  Comment) (Daughter lives 5 mins from pt ) Can pt return to current living arrangement?: Yes Admission Status: Voluntary Is patient capable of signing voluntary admission?: Yes Referral Source: MD Insurance type: MCR   Medical Screening Exam [facility] Walk-in ONLY) Medical Exam completed: No Reason for MSE not completed: Other: (None )  Crisis Care Plan Living Arrangements: Alone, Other (Comment) (Daughter lives 5 mins from pt ) Mathews of Psychiatrist: Unk  Mathews of Therapist: Unk   Education Status Is patient currently in school?: No Current Grade: None  Highest grade of school patient has  completed: None  Mathews of school: None  Contact person: None   Risk to self with the past 6 months Suicidal Ideation: No Has patient been a risk to self within the past 6 months prior to admission? : No Suicidal Intent: No Has patient had any suicidal intent within the past 6 months prior to admission? : No Is patient at risk for suicide?: No Suicidal Plan?: No Has patient had any suicidal plan within the past 6 months prior to admission? : No Access to Means: No What has been your use of drugs/alcohol within the last 12 months?: Pt denies  Previous Attempts/Gestures: No How many times?: 0 Other Self Harm Risks: None  Triggers for Past Attempts: None known Intentional Self Injurious Behavior: None Family Suicide History: No Recent stressful life event(s): Conflict (Comment) (Issues with family) Persecutory voices/beliefs?: No Depression: Yes Depression Symptoms: Insomnia, Tearfulness, Loss of interest in usual pleasures, Guilt Substance abuse history and/or treatment for substance abuse?: No Suicide prevention information given to non-admitted patients: Not applicable  Risk to Others within the past 6 months Homicidal Ideation: No Does patient have any lifetime risk of violence toward others beyond the six months prior to admission? : No Thoughts of Harm to Others: No Current Homicidal Intent: No Current Homicidal Plan: No Access to Homicidal Means: No Identified Victim: None  History of harm to others?: No Assessment of Violence: None Noted Violent Behavior Description: None  Does patient have access to weapons?: No Criminal Charges Pending?: No Does patient have a court date: No Is patient on probation?: No  Psychosis Hallucinations: None noted Delusions: None noted  Mental Status Report Appearance/Hygiene: Other (Comment) (Appropriate ) Eye Contact: Good Motor Activity: Agitation Speech: Logical/coherent, Pressured, Loud Level of Consciousness: Alert,  Crying Mood: Depressed, Sad, Anxious Affect: Anxious, Depressed, Silly Anxiety Level: Moderate Thought Processes: Coherent, Relevant Judgement: Partial Orientation: Person, Place, Time, Situation Obsessive Compulsive Thoughts/Behaviors: Moderate  Cognitive Functioning Concentration: Normal Memory: Recent Intact, Remote Intact IQ: Average Insight: Fair Impulse Control: Good Appetite: Good Weight Loss: 0 Weight Gain: 0 Sleep: Decreased Total Hours of Sleep: 2 Vegetative Symptoms: None  ADLScreening [facility] Services) Patient's cognitive ability adequate to safely complete daily activities?: Yes Patient able to express need for assistance with ADLs?: Yes Independently performs ADLs?: Yes (appropriate for developmental age)  Prior Inpatient Therapy Prior Inpatient Therapy: Yes Prior Therapy Dates: 2015 Prior Therapy Facilty/Provider(s): [facility]  Reason for Treatment: SI/HI/Depression/Anxiety   Prior Outpatient Therapy Prior Outpatient Therapy: Yes Prior Therapy Dates: Current  Prior Therapy Facilty/Provider(s): Unk  Reason for Treatment: Unk  Does patient have an ACCT team?: No Does patient have Intensive In-House Services?  : No Does patient have [facility] services? : No Does patient have P4CC services?: No  ADL Screening (condition at time of admission) Patient's cognitive ability adequate to safely complete daily activities?: Yes Is the patient deaf or have difficulty hearing?: No Does the patient  have difficulty seeing, even when wearing glasses/contacts?: No Does the patient have difficulty concentrating, remembering, or making decisions?: No Patient able to express need for assistance with ADLs?: Yes Does the patient have difficulty dressing or bathing?: No Independently performs ADLs?: Yes (appropriate for developmental age) Does the patient have difficulty walking or climbing stairs?: No Weakness of Legs: None Weakness of Arms/Hands:  None  Home Assistive Devices/Equipment Home Assistive Devices/Equipment: None  Therapy Consults (therapy consults require a physician order) PT Evaluation Needed: No OT Evalulation Needed: No SLP Evaluation Needed: No Abuse/Neglect Assessment (Assessment to be complete while patient is alone) Physical Abuse: Denies Verbal Abuse: Denies Sexual Abuse: Denies Exploitation of patient/patient's resources: Denies Self-Neglect: Denies Values / Beliefs Cultural Requests During Hospitalization: None Spiritual Requests During Hospitalization: None Consults Spiritual Care Consult Needed: No Social Work Consult Needed: No Merchant navy officer (For Healthcare) Does patient have an advance directive?: No Would patient like information on creating an advanced directive?: No - patient declined information Nutrition Screen- [facility] Adult/WL/AP Patient's home diet: Regular Has the patient recently lost weight without trying?: No Has the patient been eating poorly because of a decreased appetite?: No Malnutrition Screening Tool Score: 0  Additional Information 1:1 In Past 12 Months?: No CIRT Risk: No Elopement Risk: No Does patient have medical clearance?: Yes     Disposition:  Disposition Disposition of Patient: Inpatient treatment program, Referred to (Per Hulan Fess, NP recommends inpt admission ) Type of inpatient treatment program: Adult Patient referred to: Other (Comment) (Per Hulan Fess, NP recommends inpt admission )  Murrell Redden 10/07/2014 4:23 AM

## 2014-10-07 NOTE — ED Provider Notes (Signed)
Pt has been accepted to [facility]  She is aware and is happy that she is going.    Clinical Impression: 1. Psychosis, unspecified psychosis type   2. Peripheral edema       Blake Divine, MD 10/07/14 1302

## 2014-10-07 NOTE — ED Notes (Signed)
Pt given her home meds>>>coreg 25mg ,carafate 1g,aspirin 81mg ,neurontin 100mg , prednisolone5mg  and aricert 10mg 

## 2014-10-07 NOTE — ED Notes (Signed)
Pt very upset, states that her "daughter left her and went to [place] to go on a date", pt then states that [facility] took her off her ativan last week and placed her on valium. Upon review of medications patient was placed on xanax, instead of valium. At this point pt states she can not do anything but clean and shower and sleep. Her thought process is all over the place. She provides a different answer for same questions asked. Denies any physical problems. Pt does have 2+edema to le but pt states she stopped her fluid pill because the "doctor was trying to kill her"

## 2014-10-07 NOTE — Progress Notes (Signed)
Seeking inpatient geriatric psychiatric placement for pt.  Referred to: [place]- Per Delorise Shiner, referral will be reviewed today, will call  [facility]- per [facility]- per Davonna Belling- per Theresa Duty to reach [place], [place], or [facility] as of yet, will keep attempting and refer if appropriate. [facility] at capacity.  Ilean Skill, MSW, LCSW Clinical Social Work, Disposition  10/07/2014 [phone]

## 2014-10-07 NOTE — Progress Notes (Signed)
Per French Ana at [facility] ([address], [place], [place] [address]), pt has been accepted for admission by Dr. Althea Grimmer to bed #539. RN report number is [phone], and French Ana requests report be called when transport has arrived for patient.  Spoke with Agricultural engineer re: pt's placement.   Ilean Skill, MSW, LCSW Clinical Social Work, Disposition  10/07/2014 [phone]

## 2014-10-07 NOTE — ED Notes (Signed)
Pt has been sitting up in chair since 1000 ,has done her own am care(sponge bath and mouth care)  Had cereal for breakfast  And has been doing nails  No complaints

## 2014-10-07 NOTE — ED Notes (Signed)
Spoke with Aundra Millet at [facility] about admission

## 2017-01-22 IMAGING — CR DG CHEST 2V
2 series · 2 of 2 positions shown · non-contrast
Comparison: Chest radiograph performed 09/14/2014

CLINICAL DATA: Acute onset of diarrhea, weakness and nausea.
Initial encounter.

EXAM:
CHEST  2 VIEW

[w chest pa]
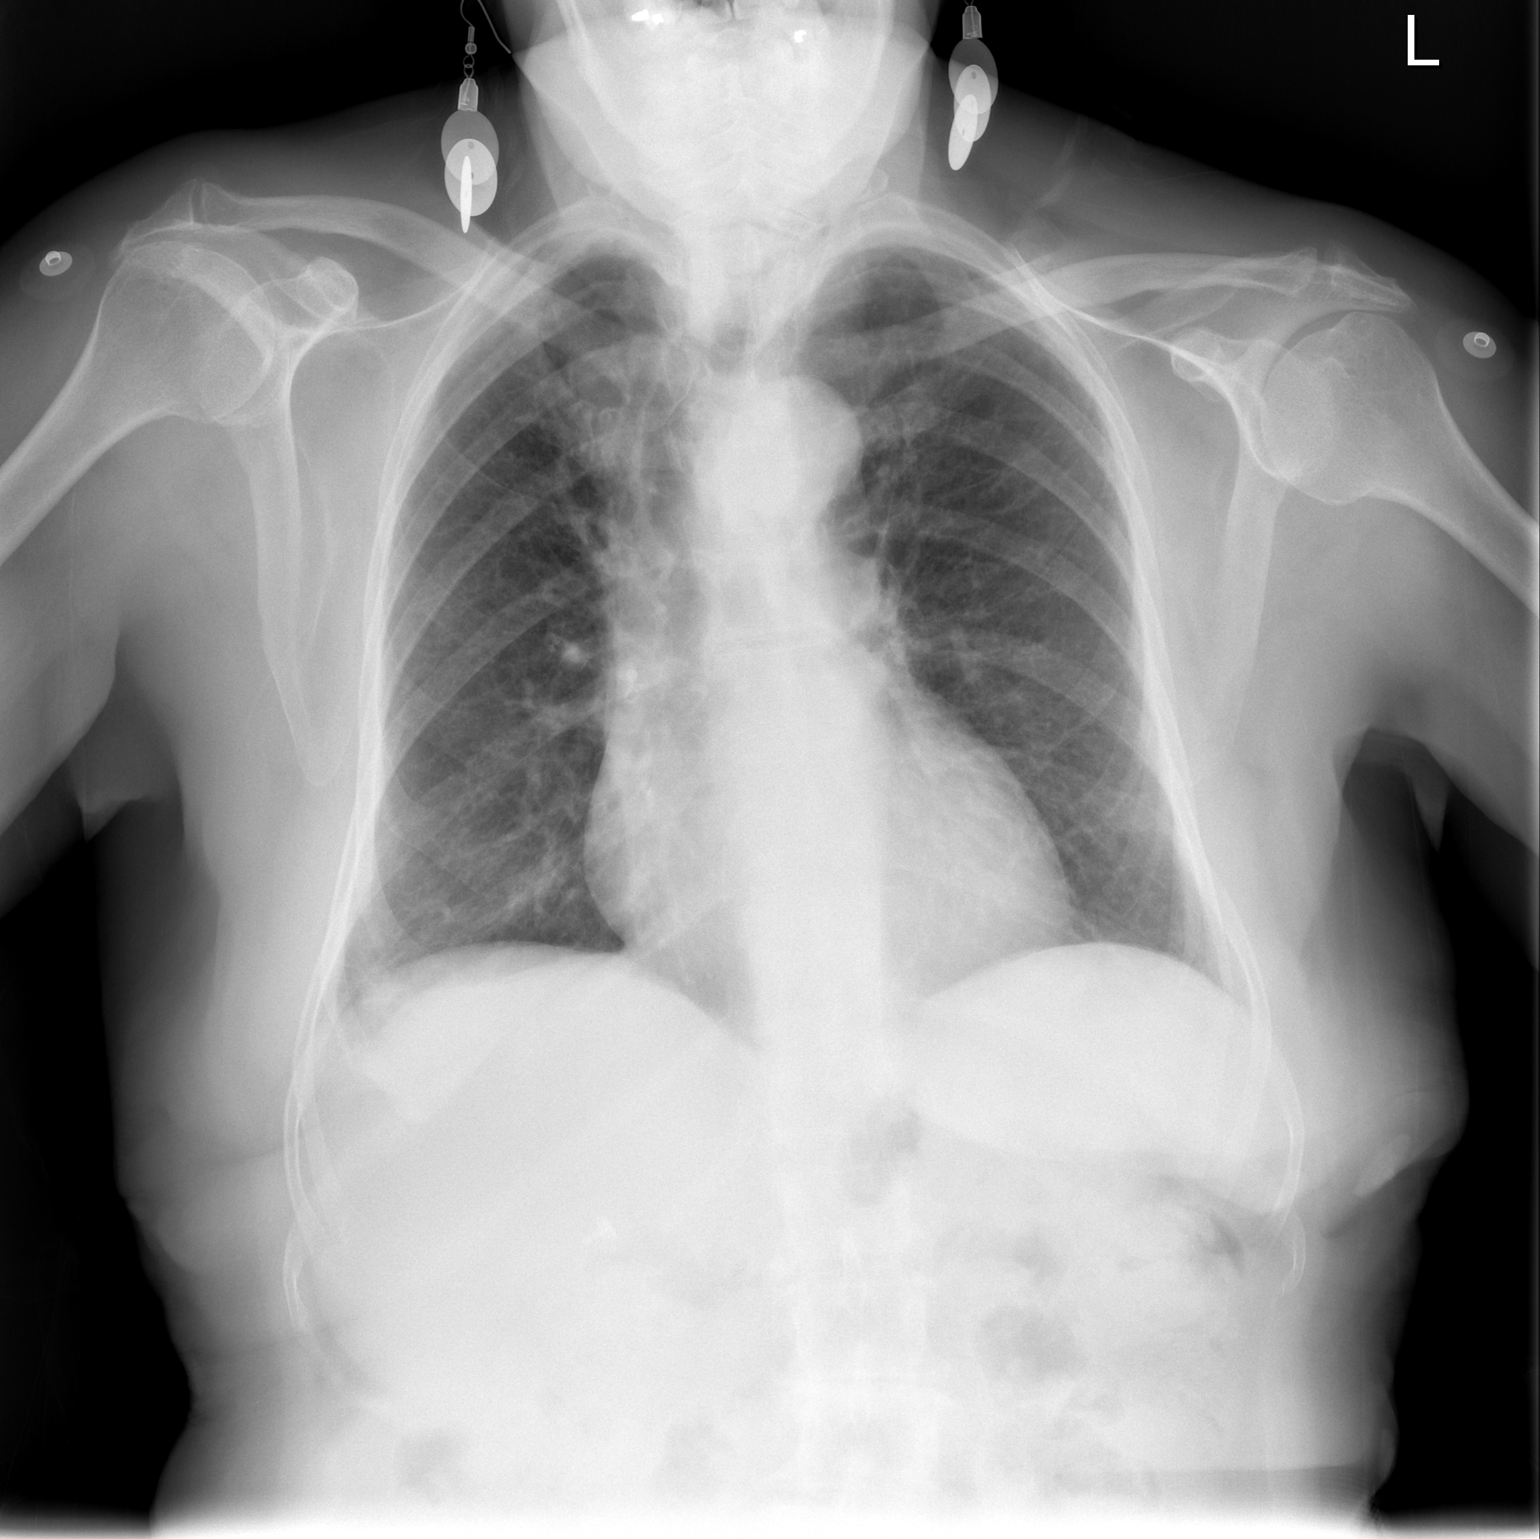

[w chest lat]
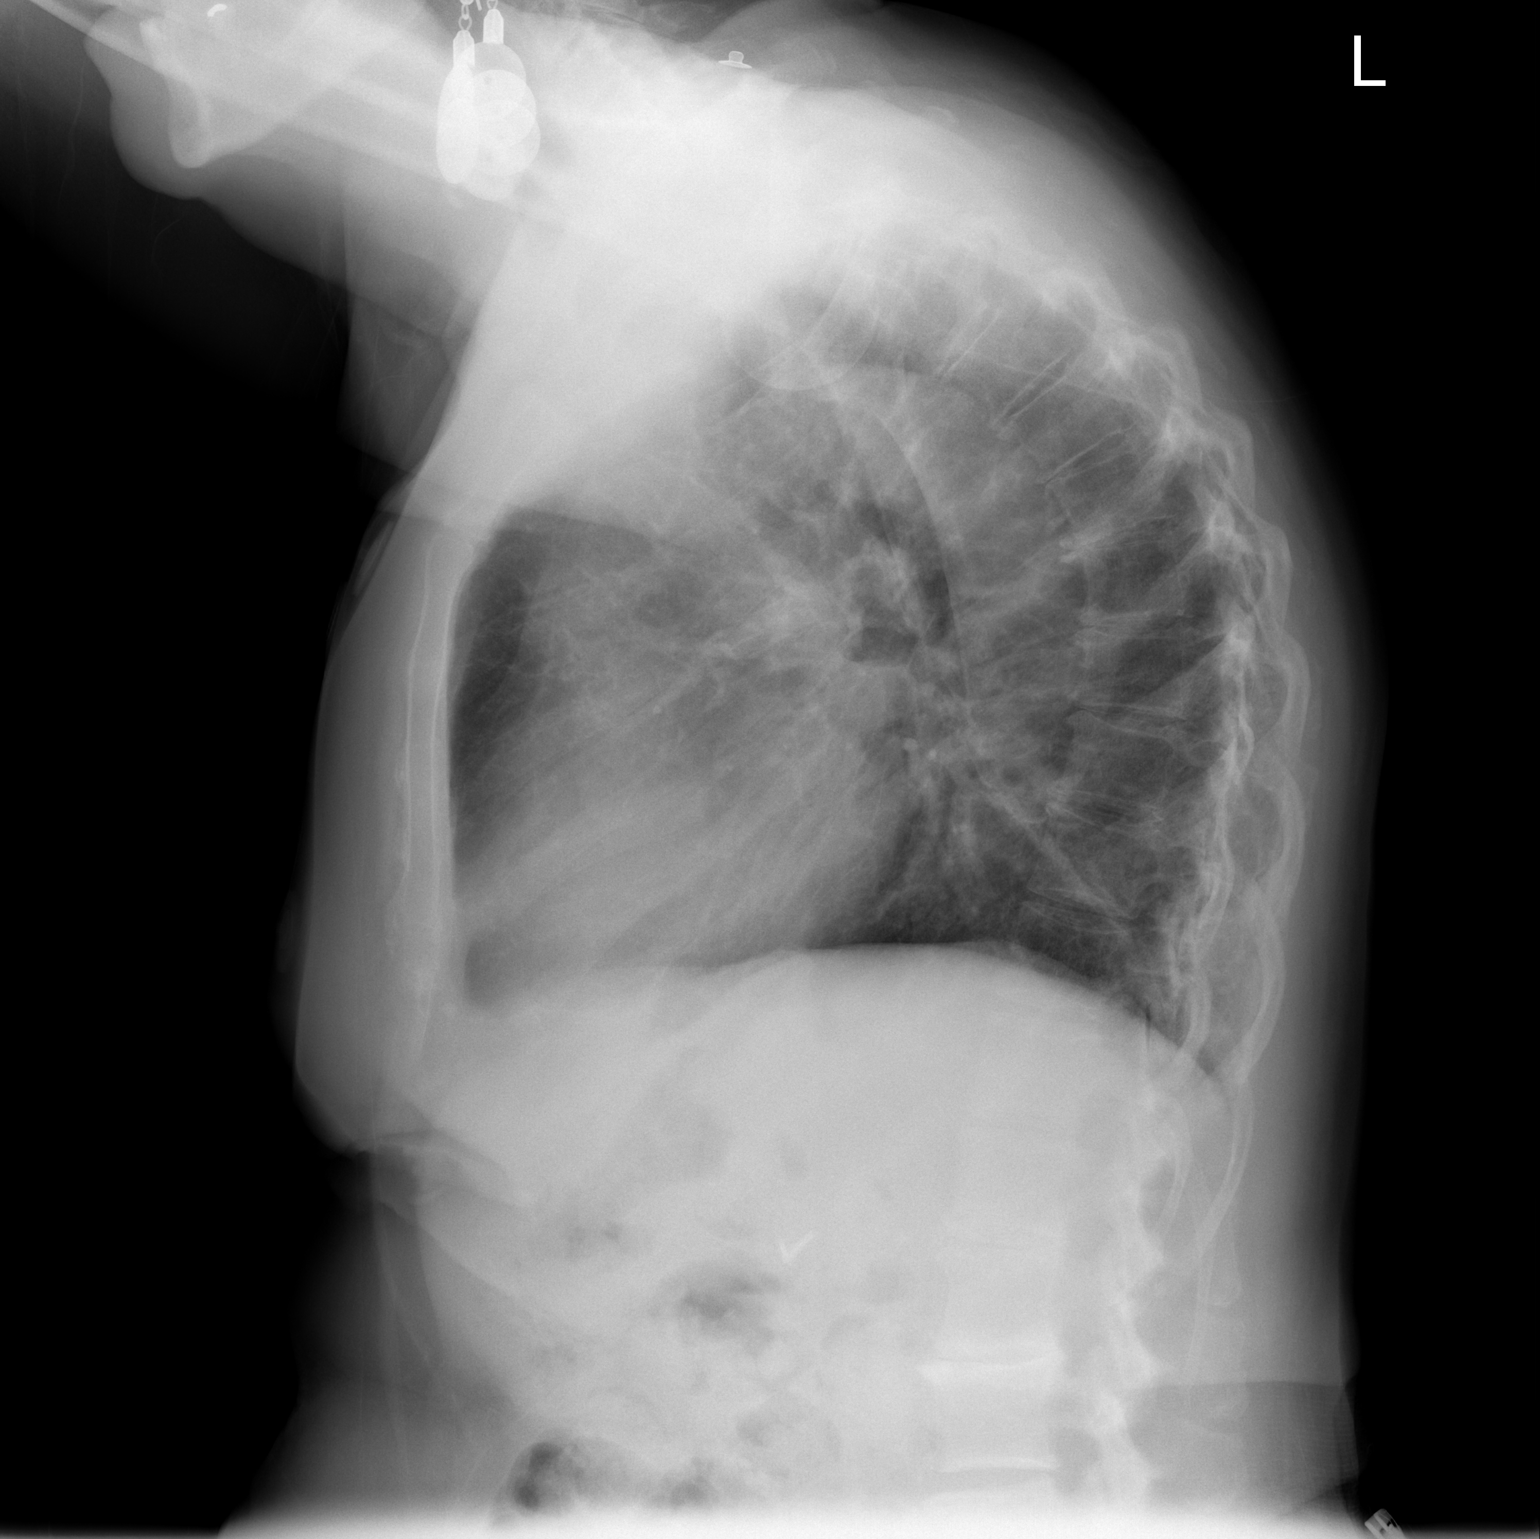

[2 of 2 positions shown; findings below may reference images not displayed]

FINDINGS: The lungs are well-aerated. Minimal left basilar atelectasis is
noted. There is no evidence of pleural effusion or pneumothorax.

The heart is borderline enlarged. Slight prominence of the right
paratracheal soft tissues is thought to reflect normal vasculature.
No acute osseous abnormalities are seen.
IMPRESSION: Minimal left basilar atelectasis noted.  Borderline cardiomegaly.

## 2017-07-24 ENCOUNTER — Other Ambulatory Visit: Payer: Self-pay

## 2017-07-24 ENCOUNTER — Emergency Department (HOSPITAL_BASED_OUTPATIENT_CLINIC_OR_DEPARTMENT_OTHER)
Admission: EM | Admit: 2017-07-24 | Discharge: 2017-07-24 | Disposition: A | Discharge status: Home / Self Care | Payer: Medicare HMO | Attending: Emergency Medicine | Admitting: Emergency Medicine

## 2017-07-24 ENCOUNTER — Encounter (HOSPITAL_BASED_OUTPATIENT_CLINIC_OR_DEPARTMENT_OTHER): Payer: Self-pay

## 2017-07-24 DIAGNOSIS — J029 Acute pharyngitis, unspecified: Secondary | ICD-10-CM | POA: Diagnosis not present

## 2017-07-24 DIAGNOSIS — F17210 Nicotine dependence, cigarettes, uncomplicated: Secondary | ICD-10-CM | POA: Diagnosis not present

## 2017-07-24 LAB — RAPID STREP SCREEN (MED CTR MEBANE ONLY): Streptococcus, Group A Screen (Direct): NEGATIVE

## 2017-07-24 NOTE — Discharge Instructions (Signed)
As discussed, your rapid strep was negative today.  Use warm beverages such as tea with honey and cough drops to help soothe your throat.  Follow-up with the [facility] to establish care with a primary care provider.  Return sooner if symptoms worsen, drooling, difficulty breathing, difficulty swallowing or any other new concerning symptoms in the meantime.

## 2017-07-24 NOTE — ED Triage Notes (Signed)
Pt states she thinks she has throat cancer, c/o sore throat and painful swallowing for the last several weeks

## 2017-07-24 NOTE — ED Provider Notes (Signed)
[facility] EMERGENCY DEPARTMENT Provider Note   CSN: [id number] Arrival date & time: 07/24/17  1644     History   Chief Complaint Chief Complaint  Patient presents with  . Sore Throat    HPI Joyce Mathews is a 77 y.o. female with past medical history significant for anxiety, bipolar 1 disorder, PTSD, CHF, CAD, hypertension and rheumatoid arthritis presenting with 1 month of sore throat. She states that she noticed an area of erythema to the back of her throat a few weeks back and reports gargling with hydrogen peroxide without relief, stating that she thinks it burned. She endorses 2 months of chills and feeling hot and cold with sweats. She states that she has seen her PCP 1.5 months ago but doesn't think that they did any blood work. She also explains that she has been sent for an outpatient CT abdomen due to one year of abdominal discomfort and diarrhea, but didn't go due to $100 co-pay. She denies any new symptoms. She denies any fever, congestion or hx of ill contacts. Patient has a chronic cough at baseline. She is an every day smoker at 1/2 pack/day.  HPI  Past Medical History:  Diagnosis Date  . Anxiety   . Bipolar 1 disorder (HCC)   . CHF (congestive heart failure) (HCC)   . CHF (congestive heart failure) (HCC)   . Congestive heart failure (HCC)   . Coronary artery disease   . Hypertension   . PTSD (post-traumatic stress disorder)   . Rheumatoid arthritis (HCC)   . Stroke [facility])     Patient Active Problem List   Diagnosis Date Noted  . Recurrent major depression-severe (HCC) 08/17/2013  . Suicidal ideations 08/17/2013    Past Surgical History:  Procedure Laterality Date  . BREAST SURGERY    . CHOLECYSTECTOMY    . TUBAL LIGATION       OB History   None      Home Medications    Prior to Admission medications   Medication Sig Start Date End Date Taking? Authorizing Provider  albuterol (PROVENTIL HFA;VENTOLIN HFA) 108 (90 BASE) MCG/ACT inhaler  Inhale 1-2 puffs into the lungs every 4 (four) hours as needed for wheezing or shortness of breath.    [provider]  alendronate (FOSAMAX) 70 MG tablet Take 70 mg by mouth once a week. Take with a full glass of water on an empty stomach.    [provider]  ALPRAZolam Prudy Feeler) 0.5 MG tablet Take 0.5 mg by mouth 2 (two) times daily.    [provider]  aspirin 81 MG tablet Take 81 mg by mouth daily.    [provider]  baclofen (LIORESAL) 10 MG tablet Take 10 mg by mouth 3 (three) times daily.    [provider]  budesonide-formoterol (SYMBICORT) 160-4.5 MCG/ACT inhaler Inhale 2 puffs into the lungs 2 (two) times daily.    [provider]  carvedilol (COREG) 25 MG tablet Take 25 mg by mouth 2 (two) times daily with a meal.    [provider]  donepezil (ARICEPT) 10 MG tablet Take 10 mg by mouth at bedtime.    [provider]  gabapentin (NEURONTIN) 100 MG capsule Take 100 mg by mouth 3 (three) times daily.    [provider]  hydrocortisone (ANUSOL-HC) 25 MG suppository Place 25 mg rectally 2 (two) times daily as needed for hemorrhoids or itching.    [provider]  ibuprofen (ADVIL,MOTRIN) 800 MG tablet Take 800 mg  by mouth every 8 (eight) hours as needed.    [provider]  Multiple Vitamin (MULTIVITAMIN) tablet Take 1 tablet by mouth daily.    [provider]  potassium chloride SA (K-DUR,KLOR-CON) 20 MEQ tablet Take 1 tablet (20 mEq total) by mouth daily. 07/29/13   Geoffery Lyons, MD  prednisoLONE 5 MG TABS tablet Take 5 mg by mouth daily.    [provider]  sucralfate (CARAFATE) 1 G tablet Take 1 g by mouth 4 (four) times daily -  with meals and at bedtime.    [provider]    Family History No family history on file.  Social History Social History   Tobacco Use  . Smoking status: Current Every Day Smoker    Packs/day: 1.00    Types: Cigarettes  Substance  Use Topics  . Alcohol use: No  . Drug use: No     Allergies   Shellfish allergy; Vesicare [solifenacin]; and Yellow jacket venom [bee venom]   Review of Systems Review of Systems  Constitutional: Positive for chills and diaphoresis. Negative for fatigue and fever.  HENT: Positive for sore throat. Negative for congestion, ear pain, facial swelling, trouble swallowing and voice change.   Respiratory: Positive for cough. Negative for choking, chest tightness, shortness of breath, wheezing and stridor.        Chronic smokers cough unchanged  Cardiovascular: Negative for chest pain, palpitations and leg swelling.  Gastrointestinal: Positive for diarrhea. Negative for nausea and vomiting.       Chronic diarrhea unchanged  Genitourinary: Negative for difficulty urinating and dysuria.  Musculoskeletal: Negative for arthralgias, back pain, neck pain and neck stiffness.  Skin: Negative for color change, pallor and rash.  Neurological: Negative for dizziness, speech difficulty, light-headedness and headaches.     Physical Exam Updated Vital Signs BP (!) 162/95 (BP Location: Right Arm)   Pulse 86   Temp 98.7 F (37.1 C) (Oral)   Resp 16   Ht 5\' 1"  (1.549 m)   Wt 77.1 kg (170 lb)   SpO2 94%   BMI 32.12 kg/m   Physical Exam  Constitutional: She appears well-developed and well-nourished.  Non-toxic appearance. She does not appear ill. No distress.  Well-appearing, nontoxic afebrile sitting comfortably in bed no acute distress  HENT:  Head: Normocephalic and atraumatic.  Mouth/Throat: Oropharynx is clear and moist and mucous membranes are normal. Mucous membranes are not pale and not dry. No oral lesions. No oropharyngeal exudate, posterior oropharyngeal edema, posterior oropharyngeal erythema or tonsillar abscesses.  Status post tonsillectomy  Eyes: Conjunctivae and EOM are normal.  Neck: Normal range of motion. Neck supple.  Cardiovascular: Normal rate, regular rhythm, normal heart  sounds and intact distal pulses.  No murmur heard. Pulmonary/Chest: Effort normal and breath sounds normal. No stridor. No respiratory distress. She has no wheezes. She has no rhonchi. She has no rales.  Abdominal: Soft. There is no tenderness.  Musculoskeletal: She exhibits no edema.  Lymphadenopathy:    She has no cervical adenopathy.  Neurological: She is alert.  Skin: Skin is warm and dry. No rash noted. She is not diaphoretic. No erythema. No pallor.  Psychiatric: She has a normal mood and affect.  Nursing note and vitals reviewed.    ED Treatments / Results  Labs (all labs ordered are listed, but only abnormal results are displayed) Labs Reviewed  RAPID STREP SCREEN ([facility] ONLY)  CULTURE, GROUP A STREP [facility])    EKG None  Radiology No results found.  Procedures Procedures (including critical care time)  Medications Ordered in ED Medications - No data to display   Initial Impression / Assessment and Plan / ED Course  I have reviewed the triage vital signs and the nursing notes.  Pertinent labs & imaging results that were available during my care of the patient were reviewed by me and considered in my medical decision making (see chart for details).    Pt presents afebrile without tonsillar exudate, negative strep. no cervical lymphadenopathy, & odynophagia; diagnosis of viral pharyngitis. No abx indicated. DC w symptomatic tx for pain.  Pt does not appear dehydrated, but did discuss importance of water rehydration. Presentation non concerning for PTA or infxn spread to soft tissue. No trismus or uvula deviation. Specific return precautions discussed. Pt able to drink water in ED without difficulty with intact airway. Recommended PCP follow up.  Patient was provided with resources for PCP follow up. She is well appearing, afebrile and nontoxic.  Return precautions discussed and patient understands and agrees with plan. Final Clinical Impressions(s) / ED  Diagnoses   Final diagnoses:  Sore throat    ED Discharge Orders    None       Gregary Cromer 07/24/17 1836    Maia Plan, MD 07/25/17 [phone]

## 2017-07-27 LAB — CULTURE, GROUP A STREP (THRC)

## 2017-07-30 NOTE — ED Notes (Signed)
07/30/2017,  Pt. Called for results of strep test.  Reviewed results with pt.  Pt. Verbalized understanding of results. All questions answered.

## 2018-10-19 LAB — BASIC METABOLIC PANEL WITH GFR
BUN: 8 (ref 4–21)
Creatinine: 0.7 (ref 0.5–1.1)
Glucose: 110
Potassium: 4.2 (ref 3.4–5.3)
Sodium: 137 (ref 137–147)

## 2018-10-19 LAB — HEPATIC FUNCTION PANEL
ALT: 13 (ref 7–35)
AST: 21 (ref 13–35)
Alkaline Phosphatase: 90 (ref 25–125)
Bilirubin, Total: 0.2

## 2018-10-19 LAB — CBC AND DIFFERENTIAL
HCT: 42 (ref 36–46)
Hemoglobin: 13.9 (ref 12.0–16.0)
Neutrophils Absolute: 5
Platelets: 168 (ref 150–399)
WBC: 10.1

## 2018-10-26 ENCOUNTER — Encounter: Payer: Self-pay | Admitting: Internal Medicine

## 2018-10-26 ENCOUNTER — Non-Acute Institutional Stay (SKILLED_NURSING_FACILITY): Payer: Medicare HMO | Admitting: Internal Medicine

## 2018-10-26 DIAGNOSIS — I10 Essential (primary) hypertension: Secondary | ICD-10-CM

## 2018-10-26 DIAGNOSIS — M79605 Pain in left leg: Secondary | ICD-10-CM

## 2018-10-26 DIAGNOSIS — G8929 Other chronic pain: Secondary | ICD-10-CM

## 2018-10-26 DIAGNOSIS — F332 Major depressive disorder, recurrent severe without psychotic features: Secondary | ICD-10-CM

## 2018-10-26 DIAGNOSIS — F419 Anxiety disorder, unspecified: Secondary | ICD-10-CM | POA: Diagnosis not present

## 2018-10-26 DIAGNOSIS — R0989 Other specified symptoms and signs involving the circulatory and respiratory systems: Secondary | ICD-10-CM | POA: Diagnosis not present

## 2018-10-26 DIAGNOSIS — E785 Hyperlipidemia, unspecified: Secondary | ICD-10-CM

## 2018-10-26 DIAGNOSIS — M069 Rheumatoid arthritis, unspecified: Secondary | ICD-10-CM

## 2018-10-26 DIAGNOSIS — J9601 Acute respiratory failure with hypoxia: Secondary | ICD-10-CM | POA: Diagnosis not present

## 2018-10-26 NOTE — Progress Notes (Signed)
: Provider:  Hennie Duos., MD Location:  [place] Room Number: 509-T Place of Service:  SNF (36)  PCP: Wallene Dales, MD Patient Care Team: Wallene Dales, MD as PCP - General (Family Medicine)  Extended Emergency Contact Information Primary Emergency Contact: Candi Leash [place] Mobile Phone: [phone] Relation: Daughter Secondary Emergency Contact: 56  [place] Mobile Phone: [phone] Relation: Daughter     Allergies: Bee venom, Hornet venom, Shellfish allergy, Solifenacin, Zonisamide, Ketorolac, Nsaids, and Tramadol  Chief Complaint  Patient presents with   New Admit To SNF    History and physical    HPI: Patient is a 78 y.o. female with migraines, rheumatoid arthritis, anemia, CAD, CVA, HFpEF (60 to 70%), hypertension, hyperlipidemia, SSS status post pacemaker, GERD, GERD, anxiety, depression, chronic abdominal pain, tobacco use, prediabetes, who presented to [facility] emergency department with chief complaint of acute left-sided weakness and numbness.  Patient had similar complaints in August 2019 with a stroke work-up that was negative.  Initial findings were negative including a CT stroke which revealed no intracranial hemorrhage no emergent large vessel occlusion or intracranial stenosis with normal CT perfusion scan of the brain.  Neurology consult was consulted in ED, TPA was recommended but declined by patient and her daughter.  Patient was admitted to [facility] from 6 24-29 where she was ultimately had CVA ruled out and was treated with debility and anxiety and depression.  Depression has been severe enough for patient to consider wishing end-of-life so psychiatry was consulted and some medications were rearranged.  She denied suicidal ideation and was willing to resume meds now.  Patient is admitted to skilled nursing facility for OT/PT.  While at skilled nursing  facility patient will be followed forMoves all 4 arthritis treated with prednisone, hypertension treated with Coreg and hyperlipidemia treated with Lipitor..  Past Medical History:  Diagnosis Date   Anxiety    Asthma    Bipolar 1 disorder ([place])    Cellulitis    Lower limb   CHF (congestive heart failure) (HCC)    CHF (congestive heart failure) (HCC)    Congestive heart failure (HCC)    Coronary artery disease    Dementia (HCC)    Hypertension    Migraine    Peptic ulcer    PTSD (post-traumatic stress disorder)    Rheumatoid arthritis ([place])    Stroke [facility])     Past Surgical History:  Procedure Laterality Date   BREAST ENHANCEMENT SURGERY     BREAST SURGERY     CHOLECYSTECTOMY     TONSILLECTOMY     TUBAL LIGATION      Allergies as of 10/26/2018      Reactions   Bee Venom Other (See Comments), Swelling   Paralysis, loss of speech, swelling.   Passed out   [employer] Other (See Comments)   Paralysis, loss of speech, swelling.     Shellfish Allergy Rash, Other (See Comments)   Solifenacin Other (See Comments)   Unknown.   Unknown.   Unknown.     Zonisamide Other (See Comments)   unknown unknown   Ketorolac Palpitations   Nsaids Palpitations   Tramadol Itching      Medication List       Accurate as of October 26, 2018 11:59 PM. If you have any questions, ask your nurse or doctor.        STOP taking these medications   alendronate 70  MG tablet Commonly known as: FOSAMAX Stopped by: Merrilee SeashoreAnne Alois Mincer, MD   ALPRAZolam 0.5 MG tablet Commonly known as: XANAX Stopped by: Merrilee SeashoreAnne Peytin Dechert, MD   baclofen 10 MG tablet Commonly known as: LIORESAL Stopped by: Merrilee SeashoreAnne Gaylan Fauver, MD   budesonide-formoterol 160-4.5 MCG/ACT inhaler Commonly known as: SYMBICORT Stopped by: Merrilee SeashoreAnne Klyde Banka, MD   hydrocortisone 25 MG suppository Commonly known as: ANUSOL-HC Stopped by: Merrilee SeashoreAnne Baldwin [place], MD   ibuprofen 800 MG tablet Commonly known as: ADVIL Stopped by:  Merrilee SeashoreAnne Leahmarie Gasiorowski, MD   multivitamin tablet Stopped by: Merrilee SeashoreAnne Abbigale Mcelhaney, MD   potassium chloride SA 20 MEQ tablet Commonly known as: K-DUR Stopped by: Merrilee SeashoreAnne Nyella Eckels, MD   prednisoLONE 5 MG Tabs tablet Stopped by: Merrilee SeashoreAnne Zain Bingman, MD   sucralfate 1 g tablet Commonly known as: CARAFATE Stopped by: Merrilee SeashoreAnne Anusha Claus, MD     TAKE these medications   albuterol 108 (90 Base) MCG/ACT inhaler Commonly known as: VENTOLIN HFA Inhale 2 puffs into the lungs every 6 (six) hours as needed for wheezing or shortness of breath.   aspirin 81 MG tablet Take 81 mg by mouth daily.   atorvastatin 40 MG tablet Commonly known as: LIPITOR Take 40 mg by mouth daily at 6 PM.   butalbital-acetaminophen-caffeine 50-325-40 MG tablet Commonly known as: FIORICET Take 1 tablet by mouth daily. Do not exceed 15 tablets per month.   carvedilol 25 MG tablet Commonly known as: COREG Take 25 mg by mouth 2 (two) times daily with a meal.   donepezil 10 MG tablet Commonly known as: ARICEPT Take 10 mg by mouth at bedtime.   DULoxetine 20 MG capsule Commonly known as: CYMBALTA Take 20 mg by mouth 2 (two) times daily.   fluticasone-salmeterol 115-21 MCG/ACT inhaler Commonly known as: ADVAIR HFA Inhale 2 puffs into the lungs 2 (two) times daily.   gabapentin 300 MG capsule Commonly known as: NEURONTIN Take 300 mg by mouth 3 (three) times daily. What changed: Another medication with the same name was removed. Continue taking this medication, and follow the directions you see here. Changed by: Merrilee SeashoreAnne Nikayla Madaris, MD   LORazepam 0.5 MG tablet Commonly known as: ATIVAN Take 0.5 mg by mouth daily as needed for anxiety.   mirtazapine 15 MG tablet Commonly known as: REMERON Take 15 mg by mouth at bedtime.   Myrbetriq 50 MG Tb24 tablet Generic drug: mirabegron ER Take 50 mg by mouth daily.   omeprazole 40 MG capsule Commonly known as: PRILOSEC Take 40 mg by mouth daily.   predniSONE 20 MG tablet Commonly  known as: DELTASONE Take 20 mg by mouth daily with breakfast.   promethazine 25 MG tablet Commonly known as: PHENERGAN Take 25 mg by mouth every 8 (eight) hours as needed for nausea or vomiting.   QUEtiapine 100 MG tablet Commonly known as: SEROQUEL Take 100 mg by mouth at bedtime.   traMADol 50 MG tablet Commonly known as: ULTRAM Take 50 mg by mouth 2 (two) times daily as needed.   traZODone 150 MG tablet Commonly known as: DESYREL Take 150 mg by mouth at bedtime.       No orders of the defined types were placed in this encounter.   Immunization History  Administered Date(s) Administered   Influenza, Quadrivalent, Recombinant, Inj, Pf 04/27/2015, 05/09/2016   Influenza,inj,Quad PF,6+ Mos 04/27/2015, 05/09/2016   Pneumococcal Conjugate-13 04/27/2015, 04/27/2015   Pneumococcal Polysaccharide-23 12/20/2012    Social History   Tobacco Use   Smoking status: Current Every Day Smoker    Packs/day: 1.00  Types: Cigarettes   Smokeless tobacco: Never Used  Substance Use Topics   Alcohol use: No    Family history is   Family History  Problem Relation Age of Onset   Heart disease Mother    Heart attack Mother    Heart disease Father    Heart attack Father    Breast cancer Sister       Review of Systems  DATA OBTAINED: from patient, nurse GENERAL:  no fevers, fatigue, appetite changes SKIN: No itching, or rash EYES: No eye pain, redness, discharge EARS: No earache, tinnitus, change in hearing NOSE: No congestion, drainage or bleeding  MOUTH/THROAT: No mouth or tooth pain, No sore throat RESPIRATORY: No cough, wheezing, SOB CARDIAC: No chest pain, palpitations, lower extremity edema  GI: No abdominal pain, No N/V/D or constipation, No heartburn or reflux  GU: No dysuria, frequency or urgency, or incontinence  MUSCULOSKELETAL: No unrelieved bone/joint pain NEUROLOGIC: No headache, dizziness or focal weakness PSYCHIATRIC: No c/o anxiety or sadness    Vitals:   10/26/18 0950  BP: 110/70  Pulse: 77  Resp: 20  Temp: 98.2 F (36.8 C)  SpO2: 95%    SpO2 Readings from Last 1 Encounters:  10/26/18 95%   Body mass index is 32.12 kg/m.     Physical Exam  GENERAL APPEARANCE: Alert, conversant,  No acute distress.  SKIN: No diaphoresis rash HEAD: Normocephalic, atraumatic  EYES: Conjunctiva/lids clear. Pupils round, reactive. EOMs intact.  EARS: External exam WNL, canals clear. Hearing grossly normal.  NOSE: No deformity or discharge.  MOUTH/THROAT: Lips w/o lesions  RESPIRATORY: Breathing is even, unlabored. Lung sounds are clear   CARDIOVASCULAR: Heart RRR no murmurs, rubs or gallops. No peripheral edema.   GASTROINTESTINAL: Abdomen is soft, non-tender, not distended w/ normal bowel sounds. GENITOURINARY: Bladder non tender, not distended  MUSCULOSKELETAL: No abnormal joints or musculature NEUROLOGIC:  Cranial nerves 2-12 grossly intact. Moves all extremities  PSYCHIATRIC: Mood and affect appropriate to situation, no behavioral issues  Patient Active Problem List   Diagnosis Date Noted   Recurrent major depression-severe (HCC) 08/17/2013   Suicidal ideations 08/17/2013      Labs reviewed: Basic Metabolic Panel:    Component Value Date/Time   NA 137 10/19/2018   K 4.2 10/19/2018   CL 103 10/07/2014 0226   CO2 30 10/07/2014 0226   GLUCOSE 93 10/07/2014 0226   BUN 8 10/19/2018   CREATININE 0.7 10/19/2018   CREATININE 0.57 10/07/2014 0226   CALCIUM 8.8 (L) 10/07/2014 0226   PROT 7.0 08/16/2013 1129   ALBUMIN 4.0 08/16/2013 1129   AST 21 10/19/2018   ALT 13 10/19/2018   ALKPHOS 90 10/19/2018   BILITOT 0.4 08/16/2013 1129   GFRNONAA >60 10/07/2014 0226   GFRAA >60 10/07/2014 0226    Recent Labs    10/19/18  NA 137  K 4.2  BUN 8  CREATININE 0.7   Liver Function Tests: Recent Labs    10/19/18  AST 21  ALT 13  ALKPHOS 90   No results for input(s): LIPASE, AMYLASE in the last 8760 hours. No  results for input(s): AMMONIA in the last 8760 hours. CBC: Recent Labs    10/19/18  WBC 10.1  NEUTROABS 5  HGB 13.9  HCT 42  PLT 168   Lipid No results for input(s): CHOL, HDL, LDLCALC, TRIG in the last 8760 hours.  Cardiac Enzymes: No results for input(s): CKTOTAL, CKMB, CKMBINDEX, TROPONINI in the last 8760 hours. BNP: No results for input(s): BNP  in the last 8760 hours. No results found for: MICROALBUR No results found for: HGBA1C No results found for: TSH No results found for: VITAMINB12 No results found for: FOLATE No results found for: IRON, TIBC, FERRITIN  Imaging and Procedures obtained prior to SNF admission: No results found.   Not all labs, radiology exams or other studies done during hospitalization come through on my EPIC note; however they are reviewed by me.    Assessment and Plan  Suspected CVA- presented with acute onset left-sided weakness and paresthesia, symptoms improved although continued to have weakness and subjective numbness; CT stroke study negative neurology team recommended TPA patient daughter refused, MRI obtained negative for acute CVA work-up on 8/20 reviewed A1c at 6% TSH normal echo bubble without PFO MRI negative for CVA, carotid Doppler without significant stenosis; continue on ASA and statin for CVA prophylaxis; B12 and folate within normal limits SNF- admitted for OT/PT; continue ASA 81 mg daily and Lipitor 40 mg daily for CVA prophylaxis  Anxiety with depression- at one point patient was thinking she is in care if she died; therefore psych was consulted and recommended Aricept 10 mg daily Cymbalta 20 mg twice daily and trazodone 150 mg nightly Remeron and Seroquel were stopped; psych cleared patient for discharge SNF- continue Aricept 10 mg daily, Cymbalta 20 mg twice daily and trazodone 150 mg nightly tissue she like him keep watching acute labs below and proving that there is someone for everyone and with the boy p.o. at p.o. said she  like all memory delayed and tells me that number  Cough/acute respiratory failure with hypoxia -Patient reported 3 to 4 weeks of cough and shortness of breath requiring O2-COVID 19 was negative, patient was weaned off oxygen but had good noted nocturnal desaturation requiring 1 to 2 L at times possible OSA, sleep study can be considered per PCP SNF- patient can follow-up for sleep study when discharged from SNF  Hypertension SNF- controlled; continue Coreg 25 mg twice daily  Rheumatoid arthritis SNF patient is on prednisone 20 mg daily, PCP might need to consider some alternative therapy  Hyperlipidemia SNF-not stated as uncontrolled; continue Lipitor 40 mg daily  Chronic left leg pain SNF- OT/PT   Time spent greater than 45 minutes;> 50% of time with patient was spent reviewing records, labs, tests and studies, counseling and developing plan of care  Margit Hanks, MD

## 2018-10-28 LAB — BASIC METABOLIC PANEL
BUN: 13 (ref 4–21)
Creatinine: 0.4 — AB (ref 0.5–1.1)
Glucose: 92
Potassium: 4.1 (ref 3.4–5.3)
Sodium: 142 (ref 137–147)

## 2018-10-28 LAB — CBC AND DIFFERENTIAL
HCT: 36 (ref 36–46)
Hemoglobin: 12 (ref 12.0–16.0)
Neutrophils Absolute: 5
Platelets: 134 — AB (ref 150–399)
WBC: 8.3

## 2018-11-01 ENCOUNTER — Encounter: Payer: Self-pay | Admitting: Internal Medicine

## 2018-11-01 DIAGNOSIS — E785 Hyperlipidemia, unspecified: Secondary | ICD-10-CM | POA: Insufficient documentation

## 2018-11-01 DIAGNOSIS — F419 Anxiety disorder, unspecified: Secondary | ICD-10-CM | POA: Insufficient documentation

## 2018-11-01 DIAGNOSIS — M069 Rheumatoid arthritis, unspecified: Secondary | ICD-10-CM | POA: Insufficient documentation

## 2018-11-01 DIAGNOSIS — M79606 Pain in leg, unspecified: Secondary | ICD-10-CM | POA: Insufficient documentation

## 2018-11-01 DIAGNOSIS — R0989 Other specified symptoms and signs involving the circulatory and respiratory systems: Secondary | ICD-10-CM | POA: Insufficient documentation

## 2018-11-01 DIAGNOSIS — G8929 Other chronic pain: Secondary | ICD-10-CM | POA: Insufficient documentation

## 2018-11-01 DIAGNOSIS — I10 Essential (primary) hypertension: Secondary | ICD-10-CM | POA: Insufficient documentation

## 2018-11-01 DIAGNOSIS — J9601 Acute respiratory failure with hypoxia: Secondary | ICD-10-CM | POA: Insufficient documentation

## 2018-11-04 ENCOUNTER — Encounter: Payer: Self-pay | Admitting: Internal Medicine

## 2018-11-04 ENCOUNTER — Non-Acute Institutional Stay (SKILLED_NURSING_FACILITY): Payer: Medicare HMO | Admitting: Internal Medicine

## 2018-11-04 DIAGNOSIS — R0989 Other specified symptoms and signs involving the circulatory and respiratory systems: Secondary | ICD-10-CM | POA: Diagnosis not present

## 2018-11-04 DIAGNOSIS — F419 Anxiety disorder, unspecified: Secondary | ICD-10-CM

## 2018-11-04 DIAGNOSIS — M79605 Pain in left leg: Secondary | ICD-10-CM

## 2018-11-04 DIAGNOSIS — F332 Major depressive disorder, recurrent severe without psychotic features: Secondary | ICD-10-CM

## 2018-11-04 DIAGNOSIS — G8929 Other chronic pain: Secondary | ICD-10-CM

## 2018-11-04 DIAGNOSIS — J9601 Acute respiratory failure with hypoxia: Secondary | ICD-10-CM

## 2018-11-04 DIAGNOSIS — I10 Essential (primary) hypertension: Secondary | ICD-10-CM

## 2018-11-04 NOTE — Progress Notes (Signed)
Location:  [facility] Room Number: 503-P Place of Service:  SNF [phone]) Provider:  Edmon Crape, PA-C  Patient Care Team: Ananias Pilgrim, MD as PCP - General (Family Medicine)  Extended Emergency Contact Information Primary Emergency Contact: Tiney Rouge [place] Mobile Phone: [phone] Relation: Daughter Secondary Emergency Contact: Vanessa Kick [place] Mobile Phone: [phone] Relation: Daughter  Code Status:  Full Code Goals of care: Advanced Directive information Advanced Directives 10/07/2014  Does Patient Have a Medical Advance Directive? No  Would patient like information on creating a medical advance directive? No - patient declined information     Chief Complaint  Patient presents with  . Discharge Note    Patient is scheduled to be discharged home on 11/06/18.    HPI:  Pt is a 78 y.o. female seen today for a discharge visit.  She was here for short-term rehab with some debility complicated with anxiety and depression.  She will be going home apparently with strong family support- she will need PT and OT for further strengthening as well as nursing support for multiple medical issues.  Per nursing did not report really any recent acute issues she appears to have done well here-and she is looking forward to going home.  She does have a complicated medical history including a history of migraines rheumatoid arthritis anemia coronary artery disease CVA ejection fraction of 65 to 70%-hypertension as well as hyperlipidemia and status post pacemaker as well as GERD chronic abdominal pain and tobacco use and prediabetes.  She initially presented to the ER in [facility] complaining of acute left-sided weakness and numbness.  Apparently she had similar complaints in August of last year with a stroke work-up that was negative.  Initial findings included a negative CT scan.  Neurology was consulted in  the ED TPA was recommended but declined by the patient and her daughter.  She was admitted to the hospital from June 24 to June 29 where she ultimately had a CVA ruled out and was treated for debility with anxiety and depression.  Depression had been severe enough for patient to consider wishing end-of-life so psychiatry was consulted medications were rearranged.  She denied any suicidal idealization was willing to resume meds.  Sheis currently on Aricept 10 mg a day which she was started on the hospital as well as Cymbalta 20 mg twice daily and trazodone 150 mg- nursing does not report any significant  Increased depressive symptoms other than stating that she would really like to go home. And this appears to lift her spirits.  She continues to be bright and alert and talkative-- was very pleasant to talk with.  She denies any feelings of wanting to harm herself in fact she laughed when I asked her.  In regards to her other medical issues these appear to be stable.  She does have a diagnosis of CHF and has gained about 5 pounds over the past week but per nursing her edema appears to be at baseline she is not complaining of any increased chest pain or shortness of breath apparently she is eating better and it is thought this is probably the etiology of her weight gain.  In regards to a suspected CVA her symptoms improved but continued to have subjective weakness.  She is continued on aspirin 81 mg a day and Lipitor 40 mg a day B12 and folate were within normal limits.  She will need continued PT and OT as an  outpatient.  She also has a history of rheumatoid arthritis she is followed by rheumatology and continues on prednisone 20 mg a day it appears she has an appointment with her rheumatologist later this month.  She also still has chronic limb pain she has been on tramadol as needed and also continues on Neurontin routinely 3 times a day--per nursing does not take tramadol much at all..    Currently she is sitting in her wheelchair comfortably she is bright alert talkative appears to be in good spirits and looking forward to going  Home.        Past Medical History:  Diagnosis Date  . Anxiety   . Asthma   . Bipolar 1 disorder (HCC)   . Cellulitis    Lower limb  . CHF (congestive heart failure) (HCC)   . CHF (congestive heart failure) (HCC)   . Congestive heart failure (HCC)   . Coronary artery disease   . Dementia (HCC)   . Hypertension   . Migraine   . Peptic ulcer   . PTSD (post-traumatic stress disorder)   . Rheumatoid arthritis (HCC)   . Stroke [facility](HCC)    Past Surgical History:  Procedure Laterality Date  . BREAST ENHANCEMENT SURGERY    . BREAST SURGERY    . CHOLECYSTECTOMY    . TONSILLECTOMY    . TUBAL LIGATION      Allergies  Allergen Reactions  . Bee Venom Other (See Comments) and Swelling    Paralysis, loss of speech, swelling.   Passed out   . Hornet Venom Other (See Comments)    Paralysis, loss of speech, swelling.    . Shellfish Allergy Rash and Other (See Comments)  . Solifenacin Other (See Comments)    Unknown.   Unknown.   Unknown.     Marland Kitchen. Zonisamide Other (See Comments)    unknown unknown   . Ketorolac Palpitations  . Nsaids Palpitations  . Tramadol Itching    Outpatient Encounter Medications as of 11/04/2018  Medication Sig  . albuterol (PROVENTIL HFA;VENTOLIN HFA) 108 (90 BASE) MCG/ACT inhaler Inhale 2 puffs into the lungs every 6 (six) hours as needed for wheezing or shortness of breath.   Marland Kitchen. aspirin 81 MG tablet Take 81 mg by mouth daily.  Marland Kitchen. atorvastatin (LIPITOR) 40 MG tablet Take 40 mg by mouth daily at 6 PM.  . b complex vitamins capsule Take 1 capsule by mouth daily.  . butalbital-acetaminophen-caffeine (FIORICET) 50-325-40 MG tablet Take 1 tablet by mouth daily. Do not exceed 15 tablets per month.  . Calcium Carb-Cholecalciferol (CALCIUM + VITAMIN D3) 500-400 MG-UNIT CHEW Chew 1 tablet by mouth daily.  . carvedilol  (COREG) 25 MG tablet Take 25 mg by mouth 2 (two) times daily with a meal.  . donepezil (ARICEPT) 10 MG tablet Take 10 mg by mouth at bedtime.  . DULoxetine (CYMBALTA) 20 MG capsule Take 20 mg by mouth 2 (two) times daily.  . Ensure (ENSURE) Take 237 mLs by mouth daily.  . ergocalciferol (VITAMIN D2) 1.25 MG (50000 UT) capsule Take 50,000 Units by mouth once a week.  . fluticasone-salmeterol (ADVAIR HFA) 115-21 MCG/ACT inhaler Inhale 2 puffs into the lungs 2 (two) times daily.  Marland Kitchen. gabapentin (NEURONTIN) 300 MG capsule Take 300 mg by mouth 3 (three) times daily.  Marland Kitchen. LORazepam (ATIVAN) 0.5 MG tablet Take 0.5 mg by mouth daily as needed for anxiety.  . mirabegron ER (MYRBETRIQ) 50 MG TB24 tablet Take 50 mg by mouth daily.  Marland Kitchen. omeprazole (  PRILOSEC) 40 MG capsule Take 40 mg by mouth daily.  . predniSONE (DELTASONE) 20 MG tablet Take 20 mg by mouth daily with breakfast.  . promethazine (PHENERGAN) 25 MG tablet Take 25 mg by mouth every 8 (eight) hours as needed for nausea or vomiting.  . traMADol (ULTRAM) 50 MG tablet Take 50 mg by mouth 2 (two) times daily as needed.  . traZODone (DESYREL) 150 MG tablet Take 150 mg by mouth at bedtime.  . [DISCONTINUED] mirtazapine (REMERON) 15 MG tablet Take 15 mg by mouth at bedtime.  . [DISCONTINUED] predniSONE (DELTASONE) 20 MG tablet Take 20 mg by mouth daily with breakfast.  . [DISCONTINUED] QUEtiapine (SEROQUEL) 100 MG tablet Take 100 mg by mouth at bedtime.   No facility-administered encounter medications on file as of 11/04/2018.     Review of Systems   In general she is not complaining of any fever or chills.  Skin does not complain of rashes or itching.  Head ears eyes nose mouth and throat does not complain of visual changes or sore throat.  Respiratory is not complaining being short of breath with a cough.  Cardiac denies chest pain has some quite mild lower extremity edema per nursing this is unchanged.  GI is not complaining of any abdominal pain  nausea vomiting diarrhea constipation per nursing she is eating better.  GU does not complain of dysuria.  Musculoskeletal is not really complaining of joint pain at this time.  Neurologic does not complain of dizziness  numbness does have weakness at baseline. She does have a history of migraines and says she has had some intermittently here  Psych appears to be in better spirits she is looking forward to going home does not complain of overt depression and denies any thought of trying to harm herself  Immunization History  Administered Date(s) Administered  . Influenza, Quadrivalent, Recombinant, Inj, Pf 04/27/2015, 05/09/2016  . Influenza,inj,Quad PF,6+ Mos 04/27/2015, 05/09/2016  . Pneumococcal Conjugate-13 04/27/2015, 04/27/2015  . Pneumococcal Polysaccharide-23 12/20/2012   Pertinent  Health Maintenance Due  Topic Date Due  . DEXA SCAN  03/07/2004  . INFLUENZA VACCINE  09/26/2018  . PNA vac Low Risk Adult  Completed   No flowsheet data found. Functional Status Survey:    Vitals:   11/04/18 1634  BP: 101/62  Pulse: 70  Resp: 18  Temp: 97.8 F (36.6 C)  TempSrc: Oral  SpO2: 95%  Weight: 167 lb 9.6 oz (76 kg)  Height: 5\' 2"  (1.575 m)  Body mass index is 30.65 kg/m. Physical Exam Weight is 167.6 pounds.  In general this is a pleasant elderly female in no distress sitting comfortably in her chair.  Her skin is warm and dry.  Eyes visual acuity appears grossly intact sclera and conjunctive are clear.  Oropharynx is clear mucous membranes moist.  Chest is clear to auscultation there is no labored breathing.  Heart is regular rate and rhythm without murmur gallop or rub she has mild lower extremity edema per nursing this is unchanged.  Abdomen is soft somewhat obese nontender with positive bowel sounds.  Musculoskeletal is able to move all extremities x4 with lower extremity weakness she does have pain to palpation of her legs bilaterally but this is not new.   Neurologic appears grossly intact without lateralizing findings her speech is clear cranial nerves appear grossly intact.  Psych she is alert and oriented pleasant appropriate was able to tell me about her past she had lived in [place] at one point  Labs reviewed:  October 28, 2018.  WBC 8.3 hemoglobin 12.0 platelets 134.  Sodium 142 potassium 4.1 BUN 13.2 creatinine 0.4   Recent Labs    10/19/18  NA 137  K 4.2  BUN 8  CREATININE 0.7   Recent Labs    10/19/18  AST 21  ALT 13  ALKPHOS 90   Recent Labs    10/19/18  WBC 10.1  NEUTROABS 5  HGB 13.9  HCT 42  PLT 168   No results found for: TSH No results found for: HGBA1C No results found for: CHOL, HDL, LDLCALC, LDLDIRECT, TRIG, CHOLHDL  Significant Diagnostic Results in last 30 days:  No results found.  Assessment/Plan  #1 history of possible CVA-again she did have acute onset of left-sided weakness and paresthesia when she went to the hospital but symptoms improved with subjective numbness continuing and some weakness.  CT and MRI did not really show an acute process.  Carotid Doppler did not show significant stenosis.  She continues on aspirin 81 mg a day and Lipitor 40 mg a day for CVA prophylaxis.  2.  Anxiety with depression-apparently this was a significant issue as noted above she did see psych in the hospital and she has been started on Aricept 10 mg a day and Cymbalta 20 mg twice daily as well as trazodone 150 mg at night- her Remeron and Seroquel were stopped-she was cleared for discharge and appears to be stable in this regards but will need expedient follow-up by primary care provider-she is really looking forward to going home  #3- history of respiratory failure with hypoxia-apparently at one point she reported a 3 to 4-week course of cough and shortness of breath and required oxygen-October 19 was negative he was weaned off oxygen but did have noted nocturnal desaturation that required oxygen at  times- there is consideration for sleep study there which can be arranged as an outpatient by her primary care provider.  4.-  Hypertension-she is on Coreg 25 mg twice daily this appears to be controlled we will need follow-up by primary care provider.  5.  Rheumatoid arthritis again she is followed by rheumatology she is on prednisone 20 mg a day-appears.  She has an appointment scheduled for later this month .  6.  History of chronic left leg pain-she does not really complain of pain today she did have a PRN tramadol ordered she is also on Neurontin 300 mg 3 times daily and I suspect Cymbalta may also help with this but she is on 20 mg twice daily--Per nursing she does not take her tramadol much at all and tries to avoid using this and thus we  will discontinue it   #7- history of migraine she does have an order for Esgic 50-325-40 dailyif needed-- no more than 15 tablets in 1 month- apparently she does have intermittent migraines but this is not new she is not complaining of a migraine currently--per nursing she is taking this about every other day nursing has been monitoring this--will give a limited supply upon discharge of 15-will need expedient follow-up with primary care provider  #8- history of anemia appears per chart review her hemoglobin says varies from the high levels to high thirteenths on recent lab it was 12.0 which appears to be within her baseline.  9.-  Hyperlipidemia she continues on Lipitor 40 a day a day since her stay here was quite short will defer to primary care provider for follow-up.  10.-  History of GERD she  is on omeprazole 40 mg a day.  11.  Apparent history of overactive bladder she is on Myrbetriq.  12.  History of CHF-this appears stable clinically she is gained about 5 pounds over the past week but edema is stable and she is not complaining of any shortness of breath even with activity with therapy or any chest pain will need follow-up by primary care provider  but clinically appears stable-she is eating better and is thought f increased appetite is contributing to the weight gain.  And she will be going home with family apparently is very supportive she will need home health assistance including PT and OT as well as nursing support for her multiple diagnoses.  She also will need expedient follow-up by her primary care provider.  [id number] note greater than 30 minutes spent on this discharge summary- greater than 50% of time spent coordinating a plan of care for numerous diagnoses

## 2018-11-05 MED ORDER — PREDNISONE 20 MG PO TABS: 20.0000 mg | ORAL_TABLET | Freq: Every day | ORAL | 0 refills | Status: DC

## 2018-11-05 MED ORDER — DULOXETINE HCL 20 MG PO CPEP: 20.0000 mg | ORAL_CAPSULE | Freq: Two times a day (BID) | ORAL | 0 refills | Status: DC

## 2018-11-05 MED ORDER — MIRABEGRON ER 50 MG PO TB24: 50.0000 mg | ORAL_TABLET | Freq: Every day | ORAL | 0 refills | Status: AC

## 2018-11-05 MED ORDER — CARVEDILOL 25 MG PO TABS: 25.0000 mg | ORAL_TABLET | Freq: Two times a day (BID) | ORAL | 0 refills | Status: AC

## 2018-11-05 MED ORDER — OMEPRAZOLE 40 MG PO CPDR: 40.0000 mg | DELAYED_RELEASE_CAPSULE | Freq: Every day | ORAL | 0 refills | Status: AC

## 2018-11-05 MED ORDER — ALBUTEROL SULFATE HFA 108 (90 BASE) MCG/ACT IN AERS: 2.0000 | INHALATION_SPRAY | Freq: Four times a day (QID) | RESPIRATORY_TRACT | 0 refills | Status: AC | PRN

## 2018-11-05 MED ORDER — DONEPEZIL HCL 10 MG PO TABS: 10.0000 mg | ORAL_TABLET | Freq: Every day | ORAL | 0 refills | Status: AC

## 2018-11-05 MED ORDER — ATORVASTATIN CALCIUM 40 MG PO TABS: 40.0000 mg | ORAL_TABLET | Freq: Every day | ORAL | 0 refills | Status: AC

## 2018-11-05 MED ORDER — FLUTICASONE-SALMETEROL 115-21 MCG/ACT IN AERO: 2.0000 | INHALATION_SPRAY | Freq: Two times a day (BID) | RESPIRATORY_TRACT | 0 refills | Status: AC

## 2018-11-05 MED ORDER — GABAPENTIN 300 MG PO CAPS: 300.0000 mg | ORAL_CAPSULE | Freq: Three times a day (TID) | ORAL | 0 refills | Status: AC

## 2018-11-05 MED ORDER — PROMETHAZINE HCL 25 MG PO TABS: 25.0000 mg | ORAL_TABLET | Freq: Three times a day (TID) | ORAL | 0 refills | Status: DC | PRN

## 2018-11-05 MED ORDER — BUTALBITAL-APAP-CAFFEINE 50-325-40 MG PO TABS: 1.0000 | ORAL_TABLET | Freq: Every day | ORAL | 0 refills | Status: AC

## 2018-11-05 MED ORDER — TRAZODONE HCL 150 MG PO TABS: 150.0000 mg | ORAL_TABLET | Freq: Every day | ORAL | 0 refills | Status: AC

## 2018-11-07 DIAGNOSIS — I110 Hypertensive heart disease with heart failure: Secondary | ICD-10-CM

## 2018-11-07 DIAGNOSIS — E785 Hyperlipidemia, unspecified: Secondary | ICD-10-CM

## 2018-11-07 DIAGNOSIS — I2510 Atherosclerotic heart disease of native coronary artery without angina pectoris: Secondary | ICD-10-CM

## 2018-11-07 DIAGNOSIS — E559 Vitamin D deficiency, unspecified: Secondary | ICD-10-CM

## 2018-11-07 DIAGNOSIS — M069 Rheumatoid arthritis, unspecified: Secondary | ICD-10-CM

## 2018-11-07 DIAGNOSIS — F0390 Unspecified dementia without behavioral disturbance: Secondary | ICD-10-CM

## 2018-11-07 DIAGNOSIS — M79605 Pain in left leg: Secondary | ICD-10-CM

## 2018-11-07 DIAGNOSIS — I5030 Unspecified diastolic (congestive) heart failure: Secondary | ICD-10-CM

## 2018-11-30 ENCOUNTER — Other Ambulatory Visit: Payer: Self-pay | Admitting: Internal Medicine

## 2018-12-05 ENCOUNTER — Other Ambulatory Visit: Payer: Self-pay | Admitting: Internal Medicine

## 2019-01-22 ENCOUNTER — Other Ambulatory Visit: Payer: Self-pay | Admitting: Internal Medicine

## 2019-04-21 ENCOUNTER — Other Ambulatory Visit: Payer: Self-pay | Admitting: Internal Medicine

## 2021-11-29 ENCOUNTER — Emergency Department (HOSPITAL_COMMUNITY): Payer: Medicare HMO

## 2021-11-29 ENCOUNTER — Other Ambulatory Visit: Payer: Self-pay

## 2021-11-29 ENCOUNTER — Observation Stay (HOSPITAL_COMMUNITY)
Admission: EM | Admit: 2021-11-29 | Discharge: 2021-11-30 | Disposition: A | Discharge status: Home / Self Care | Payer: Medicare HMO | Attending: Internal Medicine | Admitting: Internal Medicine

## 2021-11-29 DIAGNOSIS — F17210 Nicotine dependence, cigarettes, uncomplicated: Secondary | ICD-10-CM | POA: Insufficient documentation

## 2021-11-29 DIAGNOSIS — Z7982 Long term (current) use of aspirin: Secondary | ICD-10-CM | POA: Insufficient documentation

## 2021-11-29 DIAGNOSIS — Z8673 Personal history of transient ischemic attack (TIA), and cerebral infarction without residual deficits: Secondary | ICD-10-CM | POA: Insufficient documentation

## 2021-11-29 DIAGNOSIS — I2510 Atherosclerotic heart disease of native coronary artery without angina pectoris: Secondary | ICD-10-CM | POA: Diagnosis not present

## 2021-11-29 DIAGNOSIS — I110 Hypertensive heart disease with heart failure: Secondary | ICD-10-CM | POA: Diagnosis not present

## 2021-11-29 DIAGNOSIS — J45909 Unspecified asthma, uncomplicated: Secondary | ICD-10-CM | POA: Diagnosis not present

## 2021-11-29 DIAGNOSIS — Z79899 Other long term (current) drug therapy: Secondary | ICD-10-CM | POA: Insufficient documentation

## 2021-11-29 DIAGNOSIS — Z7902 Long term (current) use of antithrombotics/antiplatelets: Secondary | ICD-10-CM | POA: Diagnosis not present

## 2021-11-29 DIAGNOSIS — R200 Anesthesia of skin: Secondary | ICD-10-CM | POA: Diagnosis not present

## 2021-11-29 DIAGNOSIS — R29818 Other symptoms and signs involving the nervous system: Secondary | ICD-10-CM | POA: Diagnosis present

## 2021-11-29 DIAGNOSIS — F0390 Unspecified dementia without behavioral disturbance: Secondary | ICD-10-CM | POA: Insufficient documentation

## 2021-11-29 DIAGNOSIS — I10 Essential (primary) hypertension: Secondary | ICD-10-CM

## 2021-11-29 DIAGNOSIS — E785 Hyperlipidemia, unspecified: Secondary | ICD-10-CM | POA: Insufficient documentation

## 2021-11-29 DIAGNOSIS — R202 Paresthesia of skin: Secondary | ICD-10-CM

## 2021-11-29 DIAGNOSIS — I509 Heart failure, unspecified: Secondary | ICD-10-CM | POA: Insufficient documentation

## 2021-11-29 DIAGNOSIS — G629 Polyneuropathy, unspecified: Secondary | ICD-10-CM | POA: Diagnosis not present

## 2021-11-29 DIAGNOSIS — F03A0 Unspecified dementia, mild, without behavioral disturbance, psychotic disturbance, mood disturbance, and anxiety: Secondary | ICD-10-CM

## 2021-11-29 LAB — URINALYSIS, ROUTINE W REFLEX MICROSCOPIC
Bacteria, UA: NONE SEEN
Bilirubin Urine: NEGATIVE
Glucose, UA: NEGATIVE mg/dL
Ketones, ur: NEGATIVE mg/dL
Leukocytes,Ua: NEGATIVE
Nitrite: NEGATIVE
Protein, ur: NEGATIVE mg/dL
Specific Gravity, Urine: 1.008 (ref 1.005–1.030)
pH: 8 (ref 5.0–8.0)

## 2021-11-29 LAB — DIFFERENTIAL
Abs Immature Granulocytes: 0.02 10*3/uL (ref 0.00–0.07)
Basophils Absolute: 0.1 10*3/uL (ref 0.0–0.1)
Basophils Relative: 1 %
Eosinophils Absolute: 0.3 10*3/uL (ref 0.0–0.5)
Eosinophils Relative: 4 %
Immature Granulocytes: 0 %
Lymphocytes Relative: 44 %
Lymphs Abs: 2.7 10*3/uL (ref 0.7–4.0)
Monocytes Absolute: 0.5 10*3/uL (ref 0.1–1.0)
Monocytes Relative: 8 %
Neutro Abs: 2.7 10*3/uL (ref 1.7–7.7)
Neutrophils Relative %: 43 %

## 2021-11-29 LAB — COMPREHENSIVE METABOLIC PANEL
ALT: 14 U/L (ref 0–44)
AST: 32 U/L (ref 15–41)
Albumin: 4.1 g/dL (ref 3.5–5.0)
Alkaline Phosphatase: 129 U/L — ABNORMAL HIGH (ref 38–126)
Anion gap: 10 (ref 5–15)
BUN: 12 mg/dL (ref 8–23)
CO2: 27 mmol/L (ref 22–32)
Calcium: 9.7 mg/dL (ref 8.9–10.3)
Chloride: 98 mmol/L (ref 98–111)
Creatinine, Ser: 0.84 mg/dL (ref 0.44–1.00)
GFR, Estimated: >60 mL/min (ref >60)
Glucose, Bld: 90 mg/dL (ref 70–99)
Potassium: 5.5 mmol/L — ABNORMAL HIGH (ref 3.5–5.1)
Sodium: 135 mmol/L (ref 135–145)
Total Bilirubin: 0.5 mg/dL (ref 0.3–1.2)
Total Protein: 7.3 g/dL (ref 6.5–8.1)

## 2021-11-29 LAB — I-STAT CHEM 8, ED
BUN: 14 mg/dL (ref 8–23)
Calcium, Ion: 1 mmol/L — ABNORMAL LOW (ref 1.15–1.40)
Chloride: 98 mmol/L (ref 98–111)
Creatinine, Ser: 0.9 mg/dL (ref 0.44–1.00)
Glucose, Bld: 88 mg/dL (ref 70–99)
HCT: 39 % (ref 36.0–46.0)
Hemoglobin: 13.3 g/dL (ref 12.0–15.0)
Potassium: 5.4 mmol/L — ABNORMAL HIGH (ref 3.5–5.1)
Sodium: 134 mmol/L — ABNORMAL LOW (ref 135–145)
TCO2: 29 mmol/L (ref 22–32)

## 2021-11-29 LAB — CBC
HCT: 37.5 % (ref 36.0–46.0)
HCT: 33.6 % — ABNORMAL LOW (ref 36.0–46.0)
Hemoglobin: 12.3 g/dL (ref 12.0–15.0)
Hemoglobin: 11.1 g/dL — ABNORMAL LOW (ref 12.0–15.0)
MCH: 27.2 pg (ref 26.0–34.0)
MCH: 27.7 pg (ref 26.0–34.0)
MCHC: 32.8 g/dL (ref 30.0–36.0)
MCHC: 33 g/dL (ref 30.0–36.0)
MCV: 82.8 fL (ref 80.0–100.0)
MCV: 83.8 fL (ref 80.0–100.0)
Platelets: 193 10*3/uL (ref 150–400)
Platelets: 136 10*3/uL — ABNORMAL LOW (ref 150–400)
RBC: 4.53 MIL/uL (ref 3.87–5.11)
RBC: 4.01 MIL/uL (ref 3.87–5.11)
RDW: 14.5 % (ref 11.5–15.5)
RDW: 14.6 % (ref 11.5–15.5)
WBC: 6.2 10*3/uL (ref 4.0–10.5)
WBC: 5.3 10*3/uL (ref 4.0–10.5)
nRBC: 0 % (ref 0.0–0.2)
nRBC: 0 % (ref 0.0–0.2)

## 2021-11-29 LAB — CREATININE, SERUM
Creatinine, Ser: 0.75 mg/dL (ref 0.44–1.00)
GFR, Estimated: >60 mL/min

## 2021-11-29 LAB — RAPID URINE DRUG SCREEN, HOSP PERFORMED
Amphetamines: NOT DETECTED
Barbiturates: NOT DETECTED
Benzodiazepines: NOT DETECTED
Cocaine: NOT DETECTED
Opiates: NOT DETECTED
Tetrahydrocannabinol: NOT DETECTED

## 2021-11-29 LAB — APTT: aPTT: 27 seconds (ref 24–36)

## 2021-11-29 LAB — CBG MONITORING, ED: Glucose-Capillary: 88 mg/dL (ref 70–99)

## 2021-11-29 LAB — PROTIME-INR
INR: 1 (ref 0.8–1.2)
Prothrombin Time: 13.4 seconds (ref 11.4–15.2)

## 2021-11-29 LAB — ETHANOL: Alcohol, Ethyl (B): <10 mg/dL (ref <10)

## 2021-11-29 MED ORDER — ACETAMINOPHEN 325 MG PO TABS
650.0000 mg | ORAL_TABLET | Freq: Four times a day (QID) | ORAL | Status: DC | PRN
  Administered 2021-11-30: 650 mg via ORAL
  Filled 2021-11-29: qty 2

## 2021-11-29 MED ORDER — IOHEXOL 350 MG/ML SOLN
60.0000 mL | Freq: Once | INTRAVENOUS | Status: AC | PRN
  Administered 2021-11-29: 60 mL via INTRAVENOUS

## 2021-11-29 MED ORDER — OXYCODONE HCL 5 MG PO TABS: 5.0000 mg | ORAL_TABLET | ORAL | Status: DC | PRN

## 2021-11-29 MED ORDER — ONDANSETRON HCL 4 MG PO TABS: 4.0000 mg | ORAL_TABLET | Freq: Four times a day (QID) | ORAL | Status: DC | PRN

## 2021-11-29 MED ORDER — ACETAMINOPHEN 650 MG RE SUPP: 650.0000 mg | Freq: Four times a day (QID) | RECTAL | Status: DC | PRN

## 2021-11-29 MED ORDER — SODIUM CHLORIDE 0.9 % IV BOLUS
500.0000 mL | Freq: Once | INTRAVENOUS | Status: AC
  Administered 2021-11-29: 500 mL via INTRAVENOUS

## 2021-11-29 MED ORDER — PROCHLORPERAZINE EDISYLATE 10 MG/2ML IJ SOLN
10.0000 mg | Freq: Once | INTRAMUSCULAR | Status: AC
  Administered 2021-11-29: 10 mg via INTRAVENOUS
  Filled 2021-11-29: qty 2

## 2021-11-29 MED ORDER — HYDRALAZINE HCL 20 MG/ML IJ SOLN: 10.0000 mg | INTRAMUSCULAR | Status: DC | PRN

## 2021-11-29 MED ORDER — SODIUM CHLORIDE 0.9 % IV BOLUS
1000.0000 mL | Freq: Once | INTRAVENOUS | Status: AC
  Administered 2021-11-29: 1000 mL via INTRAVENOUS

## 2021-11-29 MED ORDER — ENOXAPARIN SODIUM 40 MG/0.4ML IJ SOSY
40.0000 mg | PREFILLED_SYRINGE | INTRAMUSCULAR | Status: DC
  Administered 2021-11-29: 40 mg via SUBCUTANEOUS
  Filled 2021-11-29: qty 0.4

## 2021-11-29 MED ORDER — ONDANSETRON HCL 4 MG/2ML IJ SOLN: 4.0000 mg | Freq: Four times a day (QID) | INTRAMUSCULAR | Status: DC | PRN

## 2021-11-29 NOTE — Code Documentation (Signed)
Stroke Response Nurse Documentation Code Documentation  Joyce Mathews is a 81 y.o. female arriving to [facility]  via [facility] on 11/29/21 with past medical hx of migraines, CVA, anxiety, bipolar, CHF, HTN, CAD. On aspirin 81 mg daily. Code stroke was activated by EMS.   Patient from home where she was LKW at 0500 and now complaining of left sided weakness and tingling/numbness, slurred speech. Pt's daughter saw her at 0500 normal and did not return home until this evening. Pt asked daughter to call 911. BP 180/110 with EMS. Patient is noncompliant with her medications.   Stroke team at the bedside on patient arrival. Labs drawn and patient cleared for CT by Dr. Darl Householder. Patient to CT with team. NIHSS 10, see documentation for details and code stroke times. Patient with disoriented, left arm weakness, bilateral leg weakness, left limb ataxia, and left decreased sensation on exam. The following imaging was completed:  CT Head and CTA. Patient is not a candidate for IV Thrombolytic due to outside the window. Patient is not a candidate for IR due to no LVO per MD.   Care Plan: Q2 vitals/neuro checks, MRI in am (patient has pacemaker), migraine cocktail.    Bedside handoff with ED RN Camryn.    Margarette Asal  Stroke Response RN  [phone]

## 2021-11-29 NOTE — ED Triage Notes (Signed)
Pt BIB [facility] for a code stroke. LSN 0500 this AM. Pt had tingling to her L side at 0600/0700 this AM. Mild dementia. On EMS arrival pt endorses dizziness and weakness/tingling to L side. Noncompliant with meds, including meds for HTN and blood thinners. Hx stroke, no deficits. EMS reports possible slurred speech.

## 2021-11-29 NOTE — H&P (Signed)
History and Physical  Patient Name: Joyce Mathews     ZOX:[id number]    DOB: 1940-04-24    DOA: 11/29/2021 PCP: Ananias Pilgrim, MD  Chief Complaint:  HPI: Joyce Mathews is a 81 y.o. with history of bipolar, CHF, CAD, dementia, hypertension, GERD, rheumatoid arthritis, prior CVA who presented to the emergency department as a code stroke.  Patient's last known normal was 5 AM this morning.  She developed some tingling and numbness on the left side of her body associated with intermittent headache.  She denies any trauma or weakness.  Family saw her this evening and patient was feeling nauseous so EMS was called.  When EMS arrived she was noted to be dizzy and weak with left-sided numbness.  She was brought to the [facility] emergency department due to concerns for stroke.  On arrival she was endorsing headache.  Neurology evaluated the patient at bedside and deemed low suspicion for stroke.  Thought to be due to complex migraine.  Rule out CVA and admission to medicine.  She was given a migraine cocktail with Compazine and fluids.  They recommended MRI which appears to be compatible per neurology.  We will plan to pursue MRI in morning to further assess for presence of CVA.  On assessment, patient endorsed persistent numbness on left side with resolution of headache and other complaints.  She denies any burning upon urination and fevers and chills.  ROS: Negative unless noted   Past Medical History:  Diagnosis Date   Anxiety    Asthma    Bipolar 1 disorder (HCC)    Cellulitis    Lower limb   CHF (congestive heart failure) (HCC)    CHF (congestive heart failure) (HCC)    Congestive heart failure (HCC)    Coronary artery disease    Dementia (HCC)    Hypertension    Migraine    Peptic ulcer    PTSD (post-traumatic stress disorder)    Rheumatoid arthritis (HCC)    Stroke [facility])     Past Surgical History:  Procedure Laterality Date   BREAST ENHANCEMENT SURGERY     BREAST SURGERY      CHOLECYSTECTOMY     TONSILLECTOMY     TUBAL LIGATION      Social History: Patient lives alone  Allergies  Allergen Reactions   Bee Venom Other (See Comments) and Swelling    Paralysis, loss of speech, swelling.   Passed out    [employer] Other (See Comments)    Paralysis, loss of speech, swelling.     Shellfish Allergy Rash and Other (See Comments)   Shellfish-Derived Products Other (See Comments) and Rash   Solifenacin Other (See Comments)    Unknown.   Unknown.   Unknown.      Zonisamide Other (See Comments)    unknown unknown    Ketorolac Palpitations   Nsaids Palpitations   Tramadol Itching    Family history: family history includes Breast cancer in her sister; Heart attack in her father and mother; Heart disease in her father and mother.  Prior to Admission medications   Medication Sig Start Date End Date Taking? Authorizing Provider  albuterol (VENTOLIN HFA) 108 (90 Base) MCG/ACT inhaler Inhale 2 puffs into the lungs every 6 (six) hours as needed for wheezing or shortness of breath. Patient not taking: Reported on 11/29/2021 11/05/18   Roena Malady, PA-C  aspirin 81 MG tablet Take 81 mg by mouth daily. Patient not taking: Reported on 11/29/2021    [provider]  aspirin EC 81 MG tablet Take 1 tablet by mouth daily. Patient not taking: Reported on 11/29/2021 08/27/13   [provider]  atorvastatin (LIPITOR) 40 MG tablet Take 1 tablet (40 mg total) by mouth daily at 6 PM. Patient not taking: Reported on 11/29/2021 11/05/18   Roena Malady, PA-C  atorvastatin (LIPITOR) 40 MG tablet Take by mouth. Patient not taking: Reported on 11/29/2021 10/19/18   [provider]  b complex vitamins capsule Take 1 capsule by mouth daily. Patient not taking: Reported on 11/29/2021    [provider]  butalbital-acetaminophen-caffeine (FIORICET) 50-325-40 MG tablet Take 1 tablet by mouth daily. Do not exceed 15 tablets per month. Patient not taking:  Reported on 11/29/2021 11/05/18   Roena Malady, PA-C  Calcium Carb-Cholecalciferol (CALCIUM + VITAMIN D3) 500-400 MG-UNIT CHEW Chew 1 tablet by mouth daily. Patient not taking: Reported on 11/29/2021    [provider]  calcium carbonate (OS-CAL) 1250 (500 Ca) MG chewable tablet Chew by mouth. Patient not taking: Reported on 11/29/2021    [provider]  carvedilol (COREG) 25 MG tablet Take 1 tablet (25 mg total) by mouth 2 (two) times daily with a meal. Patient not taking: Reported on 11/29/2021 11/05/18   Roena Malady, PA-C  clopidogrel (PLAVIX) 75 MG tablet Take 1 tablet by mouth daily. Patient not taking: Reported on 11/29/2021 08/27/13   [provider]  donepezil (ARICEPT) 10 MG tablet Take 1 tablet (10 mg total) by mouth at bedtime. Patient not taking: Reported on 11/29/2021 11/05/18   Roena Malady, PA-C  DULoxetine (CYMBALTA) 20 MG capsule Take 1 capsule (20 mg total) by mouth 2 (two) times daily. Patient not taking: Reported on 11/29/2021 11/05/18   Roena Malady, PA-C  DULoxetine (CYMBALTA) 60 MG capsule Take 1 capsule by mouth daily. Patient not taking: Reported on 11/29/2021 10/08/16   [provider]  Ensure (ENSURE) Take 237 mLs by mouth daily. Patient not taking: Reported on 11/29/2021    [provider]  ergocalciferol (VITAMIN D2) 1.25 MG (50000 UT) capsule Take 1 capsule by mouth See admin instructions. Take one capsule (50,000 Units Total) by mouth once a week for 7 doses. Patient not taking: Reported on 11/29/2021 10/29/18   [provider]  fluticasone-salmeterol (ADVAIR HFA) 115-21 MCG/ACT inhaler Inhale 2 puffs into the lungs 2 (two) times daily. Patient not taking: Reported on 11/29/2021 11/05/18   Roena Malady, PA-C  furosemide (LASIX) 20 MG tablet Take 1 tablet by mouth 2 (two) times daily. Patient not taking: Reported on 11/29/2021 08/27/13   [provider]  gabapentin (NEURONTIN) 300 MG capsule Take 1 capsule (300 mg  total) by mouth 3 (three) times daily. Patient not taking: Reported on 11/29/2021 11/05/18   Edmon Crape C, PA-C  gabapentin (NEURONTIN) 300 MG capsule Take by mouth. Patient not taking: Reported on 11/29/2021    [provider]  LORazepam (ATIVAN) 0.5 MG tablet Take 0.5 mg by mouth daily as needed for anxiety. Patient not taking: Reported on 11/29/2021    [provider]  LORazepam (ATIVAN) 0.5 MG tablet Take by mouth. Patient not taking: Reported on 11/29/2021 08/27/13   [provider]  megestrol (MEGACE) 40 MG/ML suspension Take by mouth. Patient not taking: Reported on 11/29/2021 01/14/20   [provider]  mirabegron ER (MYRBETRIQ) 50 MG TB24 tablet Take 1 tablet (50 mg total) by mouth daily. Patient not taking: Reported on 11/29/2021 11/05/18   Trudie Reed,  Arlo C, PA-C  omeprazole (PRILOSEC) 40 MG capsule Take 1 capsule (40 mg total) by mouth daily. Patient not taking: Reported on 11/29/2021 11/05/18   Roena Malady, PA-C  omeprazole (PRILOSEC) 40 MG capsule Take 1 capsule by mouth daily. Patient not taking: Reported on 11/29/2021 08/27/13   [provider]  potassium chloride SA (KLOR-CON M) 20 MEQ tablet Take 1 tablet by mouth daily. Patient not taking: Reported on 11/29/2021 08/27/13   [provider]  predniSONE (DELTASONE) 20 MG tablet Take 1 tablet (20 mg total) by mouth daily with breakfast. Patient not taking: Reported on 11/29/2021 11/05/18   Roena Malady, PA-C  promethazine (PHENERGAN) 25 MG tablet Take 1 tablet (25 mg total) by mouth every 8 (eight) hours as needed for up to 12 days for nausea or vomiting. 11/05/18 11/17/18  Roena Malady, PA-C  promethazine (PHENERGAN) 25 MG tablet TAKE 1 TABLET EVERY 8 HRS AS NEEDED FOR NAUSEA Patient not taking: Reported on 11/29/2021 05/19/18   [provider]  spironolactone (ALDACTONE) 25 MG tablet Take 1 tablet by mouth daily. Patient not taking: Reported on 11/29/2021 08/27/13   [provider]  traZODone (DESYREL) 150 MG tablet Take 1 tablet (150 mg total) by mouth at bedtime. Patient not taking: Reported on 11/29/2021 11/05/18   Roena Malady, PA-C       Physical Exam: BP 123/72   Pulse 70   Temp 97.7 F (36.5 C) (Oral)   Resp 17   SpO2 100%  General appearance: Not in acute distress Eyes: Anicteric, conjunctiva pink, lids and lashes normal. PERRL.    ENT: No nasal deformity, discharge, epistaxis.  Lymph: No cervical or supraclavicular lymphadenopathy. Skin: Warm and dry.  No jaundice.  No suspicious rashes or lesions. Cardiac: RRR, nl S1-S2, no murmurs appreciated. No edema Respiratory: Normal respiratory rate and rhythm. Abdomen: Abdomen soft, nontender.  MSK: No deformities or effusions of the large joints of the upper or lower extremities bilaterally.  Neuro: No focal neurologic deficits.  Psych: Normal mood and affect     Labs on Admission:  I have personally reviewed following labs and imaging studies: CBC: Recent Labs  Lab 11/29/21 1854 11/29/21 1904  WBC 6.2  --   NEUTROABS 2.7  --   HGB 12.3 13.3  HCT 37.5 39.0  MCV 82.8  --   PLT 193  --    Basic Metabolic Panel: Recent Labs  Lab 11/29/21 1854 11/29/21 1904  NA 135 134*  K 5.5* 5.4*  CL 98 98  CO2 27  --   GLUCOSE 90 88  BUN 12 14  CREATININE 0.84 0.90  CALCIUM 9.7  --    GFR: CrCl cannot be calculated (Unknown ideal weight.).  Liver Function Tests: Recent Labs  Lab 11/29/21 1854  AST 32  ALT 14  ALKPHOS 129*  BILITOT 0.5  PROT 7.3  ALBUMIN 4.1   No results for input(s): "LIPASE", "AMYLASE" in the last 168 hours. No results for input(s): "AMMONIA" in the last 168 hours. Coagulation Profile: Recent Labs  Lab 11/29/21 1854  INR 1.0   Cardiac Enzymes: No results for input(s): "CKTOTAL", "CKMB", "CKMBINDEX", "TROPONINI" in the last 168 hours. BNP (last 3 results) No results for input(s): "PROBNP" in the last 8760 hours.  Radiological Exams on  Admission:  CT ANGIO HEAD NECK W WO CM (CODE STROKE)  Result Date: 11/29/2021 CLINICAL DATA:  Initial evaluation for neuro deficit, stroke suspected. EXAM: CT ANGIOGRAPHY HEAD AND NECK TECHNIQUE:  Multidetector CT imaging of the head and neck was performed using the standard protocol during bolus administration of intravenous contrast. Multiplanar CT image reconstructions and MIPs were obtained to evaluate the vascular anatomy. Carotid stenosis measurements (when applicable) are obtained utilizing NASCET criteria, using the distal internal carotid diameter as the denominator. RADIATION DOSE REDUCTION: This exam was performed according to the departmental dose-optimization program which includes automated exposure control, adjustment of the mA and/or kV according to patient size and/or use of iterative reconstruction technique. CONTRAST:  60mL OMNIPAQUE IOHEXOL 350 MG/ML SOLN COMPARISON:  Head CT from earlier the same day. FINDINGS: CTA NECK FINDINGS Aortic arch: Visualized aortic arch somewhat diffusely ectatic without frank aneurysm. Normal 3 vessel branching pattern. Mild for age atheromatous disease about the arch itself. No high-grade stenosis about the origin the great vessels. Right carotid system: Right common and internal carotid arteries are diffusely tortuous without dissection or occlusion. Mild for age atheromatous change about the right carotid bulb without significant stenosis. Left carotid system: Left common and internal carotid arteries are diffusely tortuous without dissection or occlusion. Mild for age atheromatous change about the left carotid bulb without significant stenosis. Vertebral arteries: Both vertebral arteries arise from subclavian arteries. No proximal subclavian artery stenosis. Left vertebral artery slightly dominant. Vertebral arteries tortuous but widely patent without dissection or occlusion. 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 Skeleton: No discrete  or worrisome osseous lesions. Moderate cervical spondylosis, most pronounced at C5-6. Degenerative changes noted about the right TMJ. Other neck: No other acute soft tissue abnormality within the neck. Upper chest: Left-sided pacemaker/AICD. Scattered subpleural fibrotic changes noted within the visualized lungs. Visualized upper chest demonstrates no other acute finding. Review of the MIP images confirms the above findings CTA HEAD FINDINGS Anterior circulation: Petrous segments patent bilaterally. Minimal for age plaque within the carotid siphons without stenosis. A1 segments patent bilaterally. Right A1 hypoplastic. Normal anterior communicating artery complex. Anterior cerebral arteries patent without stenosis. No M1 stenosis or occlusion. No proximal MCA branch occlusion. Distal MCA branches perfused and symmetric. Posterior circulation: Both vertebral arteries patent without stenosis. Both PICA patent. Basilar patent without stenosis. Superior cerebellar and posterior cerebral arteries patent bilaterally. Venous sinuses: Patent allowing for timing the contrast bolus. Anatomic variants: As above.  No aneurysm. Review of the MIP images confirms the above findings IMPRESSION: 1. Negative CTA for large vessel occlusion or other emergent finding. 2. Mild for age atheromatous change about the carotid bifurcations and carotid siphons without hemodynamically significant stenosis. 3. Diffuse tortuosity of the major arterial vasculature of the head and neck, suggesting chronic underlying hypertension. Electronically Signed   By: Jeannine Boga M.D.   On: 11/29/2021 21:07   CT HEAD CODE STROKE WO CONTRAST  Result Date: 11/29/2021 CLINICAL DATA:  Code stroke.  Initial evaluation for acute stroke. EXAM: CT HEAD WITHOUT CONTRAST TECHNIQUE: Contiguous axial images were obtained from the base of the skull through the vertex without intravenous contrast. RADIATION DOSE REDUCTION: This exam was performed according to  the departmental dose-optimization program which includes automated exposure control, adjustment of the mA and/or kV according to patient size and/or use of iterative reconstruction technique. COMPARISON:  Prior CT from 08/24/2021. 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 No acute intracranial hemorrhage. No visible acute large vessel territory infarct. No mass lesion, midline shift or mass effect. Stable ventricular size without hydrocephalus. No extra-axial fluid collection. Vascular: No hyperdense vessel. Scattered vascular calcifications noted within the carotid siphons. Skull:  Scalp soft tissues and calvarium within normal limits. Sinuses/Orbits: Globes orbital soft tissues demonstrate no acute finding. Paranasal sinuses are largely clear. No mastoid effusion. Other: None. ASPECTS [facility] Stroke Program Early CT Score) - Ganglionic level infarction (caudate, lentiform nuclei, internal capsule, insula, M1-M3 cortex): 7 - Supraganglionic infarction (M4-M6 cortex): 3 Total score (0-10 with 10 being normal): 10 IMPRESSION: 1. No acute intracranial abnormality. 2. ASPECTS is 10. 3. 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 These results were communicated to Dr. Wilford Corner at 7:03 pm on 11/29/2021 by text page via the [facility] messaging system. Electronically Signed   By: Rise Mu M.D.   On: 11/29/2021 19:05     Assessment/Plan Ms. Avans is an 81 year old with a history of prior stroke, dementia, bipolar who presented to the ER with left-sided numbness for 12 hours.  Neurology evaluated her and thought low suspicion for CVA however prudent to obtain MRI so we will plan to do so in the morning.  Will admit on telemetry.  Neurology recommended against further stroke work-up.  Left-sided numbness - Associate with a headache and likely due to complex migraine - We will obtain MRI to assess for presence  of CVA  Plan: MRI head Monitor on telemetry As needed IV hydralazine for blood pressures over 180  Dementia-patient was previously on Aricept however is not compliant with medications at this time Bipolar-patient currently not on any medications Hyperlipidemia-patient noncompliant with Lipitor Hypertension-patient noncompliant with carvedilol Prior CVA-patient noncompliant with aspirin and Plavix Peripheral neuropathy-patient noncompliant with Cymbalta GERD-patient on PPI    DVT prophylaxis: lovenox  Code Status: full  Disposition Plan: Anticipate DC 1-2d Consults called: Neuro Admission status: obs   At the point of initial evaluation, it is my clinical opinion that admission for OBSERVATION/INPATIENT is reasonable and necessary because the patient's presenting complaints in the context of their chronic conditions represent sufficient risk of deterioration or significant morbidity to constitute reasonable grounds for close observation in the hospital setting, but that the patient may be medically stable for discharge from the hospital within 24 to 48 hours.    Medical decision making: Patient seen at 10:24 PM on 11/29/2021. What exists of the patient's chart was reviewed in depth and summarized above.    Alan Mulder [facility] Please page though AMION or Epic secure chat:  For password, contact charge nurse

## 2021-11-29 NOTE — Consult Note (Addendum)
Neurology Consultation  Reason for Consult: Code stroke Referring Physician: Dr. Lajean Saver  CC: Left-sided weakness  History is obtained from: Chart, patient's daughter, patient  HPI: Joyce Mathews is a 81 y.o. female past medical history of bipolar disorder, dementia, hypertension, PTSD, prior stroke with no residual deficits many years ago, rheumatoid arthritis, congestive heart failure, presented to the emergency room for evaluation of left-sided weakness and dizziness.  Last known well was sometime around 5 AM and later this morning she started feeling unwell.  She ports this all started with a headache and then proceeded with numbness on the left side. The family came back from work this evening and she was complaining of feeling unwell for which EMS was called.  On their evaluation, she had endorsed dizziness and was endorsing weakness and tingling on the left side for which a code stroke was activated. I spoke with the daughter [name] over the phone-see reports that the patient has been keeping unwell for about a month.  She has always been somebody who struggled with pain and discomfort and they did not know over the last few days whether this was something acute or just part of her chronic pain process.  Seen a few weeks ago for acute non-intractable headache at [facility] with CT, treated for migraine and sent home for recommendations with outpatient follow-up   LKW: 0 500 although might be before that IV thrombolysis given?: no, outside the window Premorbid modified Rankin scale (mRS): 4 0-Completely asymptomatic and back to baseline post-stroke 1-No significant post stroke disability and can perform usual duties with stroke symptoms 2-Slight disability-UNABLE to perform all activities but does not need assistance  3-Moderate disability-requires help but walks WITHOUT assistance 4-Needs assistance to walk and tend to bodily needs 5-Severe  disability-bedridden, incontinent, needs constant attention 6- Death   ROS: Full ROS was performed and is negative except as noted in the HPI.   Past Medical History:  Diagnosis Date   Anxiety    Asthma    Bipolar 1 disorder (HCC)    Cellulitis    Lower limb   CHF (congestive heart failure) (HCC)    CHF (congestive heart failure) (HCC)    Congestive heart failure (HCC)    Coronary artery disease    Dementia (HCC)    Hypertension    Migraine    Peptic ulcer    PTSD (post-traumatic stress disorder)    Rheumatoid arthritis ([place])    Stroke ([place])      Family History  Problem Relation Age of Onset   Heart disease Mother    Heart attack Mother    Heart disease Father    Heart attack Father    Breast cancer Sister      Social History:   reports that she has been smoking cigarettes. She has been smoking an average of 1 pack per day. She has never used smokeless tobacco. She reports that she does not drink alcohol and does not use drugs.  Medications  Current Facility-Administered Medications:    sodium chloride 0.9 % bolus 1,000 mL, 1,000 mL, Intravenous, Once, Lajean Saver, MD  Current Outpatient Medications:    albuterol (VENTOLIN HFA) 108 (90 Base) MCG/ACT inhaler, Inhale 2 puffs into the lungs every 6 (six) hours as needed for wheezing or shortness of breath., Disp: 18 g, Rfl: 0   aspirin 81 MG tablet, Take 81 mg by mouth daily., Disp: , Rfl:    atorvastatin (LIPITOR) 40 MG tablet, Take  1 tablet (40 mg total) by mouth daily at 6 PM., Disp: 30 tablet, Rfl: 0   b complex vitamins capsule, Take 1 capsule by mouth daily., Disp: , Rfl:    butalbital-acetaminophen-caffeine (FIORICET) 50-325-40 MG tablet, Take 1 tablet by mouth daily. Do not exceed 15 tablets per month., Disp: 15 tablet, Rfl: 0   Calcium Carb-Cholecalciferol (CALCIUM + VITAMIN D3) 500-400 MG-UNIT CHEW, Chew 1 tablet by mouth daily., Disp: , Rfl:    carvedilol (COREG) 25 MG tablet, Take 1 tablet (25 mg total)  by mouth 2 (two) times daily with a meal., Disp: 60 tablet, Rfl: 0   donepezil (ARICEPT) 10 MG tablet, Take 1 tablet (10 mg total) by mouth at bedtime., Disp: 30 tablet, Rfl: 0   DULoxetine (CYMBALTA) 20 MG capsule, Take 1 capsule (20 mg total) by mouth 2 (two) times daily., Disp: 60 capsule, Rfl: 0   Ensure (ENSURE), Take 237 mLs by mouth daily., Disp: , Rfl:    fluticasone-salmeterol (ADVAIR HFA) 115-21 MCG/ACT inhaler, Inhale 2 puffs into the lungs 2 (two) times daily., Disp: 1 Inhaler, Rfl: 0   gabapentin (NEURONTIN) 300 MG capsule, Take 1 capsule (300 mg total) by mouth 3 (three) times daily., Disp: 90 capsule, Rfl: 0   LORazepam (ATIVAN) 0.5 MG tablet, Take 0.5 mg by mouth daily as needed for anxiety., Disp: , Rfl:    mirabegron ER (MYRBETRIQ) 50 MG TB24 tablet, Take 1 tablet (50 mg total) by mouth daily., Disp: 30 tablet, Rfl: 0   omeprazole (PRILOSEC) 40 MG capsule, Take 1 capsule (40 mg total) by mouth daily., Disp: 30 capsule, Rfl: 0   predniSONE (DELTASONE) 20 MG tablet, Take 1 tablet (20 mg total) by mouth daily with breakfast., Disp: 30 tablet, Rfl: 0   promethazine (PHENERGAN) 25 MG tablet, Take 1 tablet (25 mg total) by mouth every 8 (eight) hours as needed for up to 12 days for nausea or vomiting., Disp: 10 tablet, Rfl: 0   traZODone (DESYREL) 150 MG tablet, Take 1 tablet (150 mg total) by mouth at bedtime., Disp: 30 tablet, Rfl: 0   Exam: Current vital signs: BP (!) 164/97   Pulse 73   Temp 97.7 F (36.5 C) (Oral)   Resp 20   SpO2 98%  Vital signs in last 24 hours: Temp:  [97.7 F (36.5 C)] 97.7 F (36.5 C) (10/05 1919) Pulse Rate:  [73] 73 (10/05 1919) Resp:  [20] 20 (10/05 1919) BP: (164)/(97) 164/97 (10/05 1919) SpO2:  [98 %] 98 % (10/05 1919) General: Very uncomfortable looking elderly woman HEENT: Normocephalic atraumatic Lungs: Clear Abdomen nondistended nontender Neurological exam Awake alert oriented x3 No dysarthria No aphasia Cranial nerve examination:  Pupils equal round reactive light, extraocular movements intact, visual fields full, facial sensation intact, face symmetric, auditory acuity diminished bilaterally, tongue and palate midline. Motor examination with mild drift in the left upper extremity.  Both legs are with trickle movement and no effort against gravity. Sensation diminished on left arm and leg in comparison to the right arm and leg and preserved on the face. Coordination exam with mild ataxia on the left NIH stroke scale 10  Labs I have reviewed labs in epic and the results pertinent to this consultation are:  CBC    Component Value Date/Time   WBC 6.2 11/29/2021 1854   RBC 4.53 11/29/2021 1854   HGB 13.3 11/29/2021 1904   HCT 39.0 11/29/2021 1904   PLT 193 11/29/2021 1854   MCV 82.8 11/29/2021 1854  MCH 27.2 11/29/2021 1854   MCHC 32.8 11/29/2021 1854   RDW 14.5 11/29/2021 1854   LYMPHSABS 2.7 11/29/2021 1854   MONOABS 0.5 11/29/2021 1854   EOSABS 0.3 11/29/2021 1854   BASOSABS 0.1 11/29/2021 1854    CMP     Component Value Date/Time   NA 134 (L) 11/29/2021 1904   NA 142 10/28/2018 0000   K 5.4 (H) 11/29/2021 1904   CL 98 11/29/2021 1904   CO2 30 10/07/2014 0226   GLUCOSE 88 11/29/2021 1904   BUN 14 11/29/2021 1904   BUN 13 10/28/2018 0000   CREATININE 0.90 11/29/2021 1904   CALCIUM 8.8 (L) 10/07/2014 0226   PROT 7.0 08/16/2013 1129   ALBUMIN 4.0 08/16/2013 1129   AST 21 10/19/2018 0000   ALT 13 10/19/2018 0000   ALKPHOS 90 10/19/2018 0000   BILITOT 0.4 08/16/2013 1129   GFRNONAA >60 10/07/2014 0226   GFRAA >60 10/07/2014 0226    Imaging I have reviewed the images obtained:  CT-head no acute changes CTA head and neck with no emergent LVO   Assessment:  81 year old with past history of migraines, hypertension, bipolar disorder amongst other comorbidities listed above presenting for evaluation of left-sided numbness.  On exam has some effort dependent weakness in my left-sided numbness as  well as bilateral lower extremity weakness. Had a headache in the morning which has now subsided but not completely resolved Low suspicion for stroke. Outside the window for IV thrombolysis Per modified Rankin and no LVO on imaging hence not a candidate for EVT.  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  Ordering IV Compazine and IV fluids. If symptoms do not get better after migraine medication as above, will need an MRI.  Has a pacemaker which I think is MRI compatible because she has had an MRI outside after the pacemaker placement that I can see in care everywhere.  For this she will have to wait till the morning and might benefit from an observation stay to get the imaging done. I would not pursue stroke work-up unless the MRI is positive for stroke.  I have discussed my plan with Dr. Denton Lank. -- Milon Dikes, MD Neurologist [facility] Pager: [phone]

## 2021-11-29 NOTE — ED Provider Notes (Signed)
[facility] EMERGENCY DEPARTMENT Provider Note   CSN: [id number] Arrival date & time: 11/29/21  1844  An emergency department physician performed an initial assessment on this suspected stroke patient at 1845.  History  Chief Complaint  Patient presents with   Code Stroke    Joyce Mathews is a 81 y.o. female.  Patient presents as code stroke activation. Pt reported last known at baseline around 0500 this AM. Pt notes to have tingling to left side of body early this AM and then notes feeling generally weak w mild dizziness. Pt also notes intermittent headaches, hx same. No acute, abrupt or severe head pain. Pt w hx mild dementia. Pt denies trauma or fall. No chest pain or sob. No abd pain or nv. Denies change in speech or vision.   The history is provided by the patient, medical records and the EMS personnel. The history is limited by the condition of the patient.       Home Medications Prior to Admission medications   Medication Sig Start Date End Date Taking? Authorizing Provider  albuterol (VENTOLIN HFA) 108 (90 Base) MCG/ACT inhaler Inhale 2 puffs into the lungs every 6 (six) hours as needed for wheezing or shortness of breath. Patient not taking: Reported on 11/29/2021 11/05/18   Roena Malady, PA-C  aspirin 81 MG tablet Take 81 mg by mouth daily. Patient not taking: Reported on 11/29/2021    [provider]  aspirin EC 81 MG tablet Take 1 tablet by mouth daily. Patient not taking: Reported on 11/29/2021 08/27/13   [provider]  atorvastatin (LIPITOR) 40 MG tablet Take 1 tablet (40 mg total) by mouth daily at 6 PM. Patient not taking: Reported on 11/29/2021 11/05/18   Roena Malady, PA-C  atorvastatin (LIPITOR) 40 MG tablet Take by mouth. Patient not taking: Reported on 11/29/2021 10/19/18   [provider]  b complex vitamins capsule Take 1 capsule by mouth daily. Patient not taking: Reported on 11/29/2021    [provider]   butalbital-acetaminophen-caffeine (FIORICET) 50-325-40 MG tablet Take 1 tablet by mouth daily. Do not exceed 15 tablets per month. Patient not taking: Reported on 11/29/2021 11/05/18   Roena Malady, PA-C  Calcium Carb-Cholecalciferol (CALCIUM + VITAMIN D3) 500-400 MG-UNIT CHEW Chew 1 tablet by mouth daily. Patient not taking: Reported on 11/29/2021    [provider]  calcium carbonate (OS-CAL) 1250 (500 Ca) MG chewable tablet Chew by mouth. Patient not taking: Reported on 11/29/2021    [provider]  carvedilol (COREG) 25 MG tablet Take 1 tablet (25 mg total) by mouth 2 (two) times daily with a meal. Patient not taking: Reported on 11/29/2021 11/05/18   Roena Malady, PA-C  clopidogrel (PLAVIX) 75 MG tablet Take 1 tablet by mouth daily. Patient not taking: Reported on 11/29/2021 08/27/13   [provider]  donepezil (ARICEPT) 10 MG tablet Take 1 tablet (10 mg total) by mouth at bedtime. Patient not taking: Reported on 11/29/2021 11/05/18   Roena Malady, PA-C  DULoxetine (CYMBALTA) 20 MG capsule Take 1 capsule (20 mg total) by mouth 2 (two) times daily. Patient not taking: Reported on 11/29/2021 11/05/18   Roena Malady, PA-C  DULoxetine (CYMBALTA) 60 MG capsule Take 1 capsule by mouth daily. Patient not taking: Reported on 11/29/2021 10/08/16   [provider]  Ensure (ENSURE) Take 237 mLs by mouth daily. Patient not taking: Reported on 11/29/2021    [provider]  ergocalciferol (VITAMIN D2)  1.25 MG (50000 UT) capsule Take 1 capsule by mouth See admin instructions. Take one capsule (50,000 Units Total) by mouth once a week for 7 doses. Patient not taking: Reported on 11/29/2021 10/29/18   [provider]  fluticasone-salmeterol (ADVAIR HFA) 115-21 MCG/ACT inhaler Inhale 2 puffs into the lungs 2 (two) times daily. Patient not taking: Reported on 11/29/2021 11/05/18   Roena Malady, PA-C  furosemide (LASIX) 20 MG tablet Take 1 tablet by mouth 2  (two) times daily. Patient not taking: Reported on 11/29/2021 08/27/13   [provider]  gabapentin (NEURONTIN) 300 MG capsule Take 1 capsule (300 mg total) by mouth 3 (three) times daily. Patient not taking: Reported on 11/29/2021 11/05/18   Edmon Crape C, PA-C  gabapentin (NEURONTIN) 300 MG capsule Take by mouth. Patient not taking: Reported on 11/29/2021    [provider]  LORazepam (ATIVAN) 0.5 MG tablet Take 0.5 mg by mouth daily as needed for anxiety. Patient not taking: Reported on 11/29/2021    [provider]  LORazepam (ATIVAN) 0.5 MG tablet Take by mouth. Patient not taking: Reported on 11/29/2021 08/27/13   [provider]  megestrol (MEGACE) 40 MG/ML suspension Take by mouth. Patient not taking: Reported on 11/29/2021 01/14/20   [provider]  mirabegron ER (MYRBETRIQ) 50 MG TB24 tablet Take 1 tablet (50 mg total) by mouth daily. Patient not taking: Reported on 11/29/2021 11/05/18   Roena Malady, PA-C  omeprazole (PRILOSEC) 40 MG capsule Take 1 capsule (40 mg total) by mouth daily. Patient not taking: Reported on 11/29/2021 11/05/18   Roena Malady, PA-C  omeprazole (PRILOSEC) 40 MG capsule Take 1 capsule by mouth daily. Patient not taking: Reported on 11/29/2021 08/27/13   [provider]  potassium chloride SA (KLOR-CON M) 20 MEQ tablet Take 1 tablet by mouth daily. Patient not taking: Reported on 11/29/2021 08/27/13   [provider]  predniSONE (DELTASONE) 20 MG tablet Take 1 tablet (20 mg total) by mouth daily with breakfast. Patient not taking: Reported on 11/29/2021 11/05/18   Roena Malady, PA-C  promethazine (PHENERGAN) 25 MG tablet Take 1 tablet (25 mg total) by mouth every 8 (eight) hours as needed for up to 12 days for nausea or vomiting. 11/05/18 11/17/18  Roena Malady, PA-C  promethazine (PHENERGAN) 25 MG tablet TAKE 1 TABLET EVERY 8 HRS AS NEEDED FOR NAUSEA Patient not taking: Reported on 11/29/2021 05/19/18    [provider]  spironolactone (ALDACTONE) 25 MG tablet Take 1 tablet by mouth daily. Patient not taking: Reported on 11/29/2021 08/27/13   [provider]  traZODone (DESYREL) 150 MG tablet Take 1 tablet (150 mg total) by mouth at bedtime. Patient not taking: Reported on 11/29/2021 11/05/18   Roena Malady, PA-C      Allergies    Bee venom, Hornet venom, Shellfish allergy, Shellfish-derived products, Solifenacin, Zonisamide, Ketorolac, Nsaids, and Tramadol    Review of Systems   Review of Systems  Constitutional:  Negative for chills and fever.  HENT:  Negative for trouble swallowing.   Eyes:  Negative for visual disturbance.  Respiratory:  Negative for shortness of breath.   Cardiovascular:  Negative for chest pain.  Gastrointestinal:  Negative for abdominal pain and vomiting.  Genitourinary:  Negative for dysuria.  Musculoskeletal:  Negative for back pain and neck pain.  Skin:  Negative for rash.  Neurological:  Positive for headaches.  Hematological:  Does not bruise/bleed easily.    Physical Exam Updated Vital  Signs BP 133/83   Pulse 74   Temp 97.7 F (36.5 C) (Oral)   Resp 11   SpO2 99%  Physical Exam Vitals and nursing note reviewed.  Constitutional:      Appearance: Normal appearance. She is well-developed.  HENT:     Head: Atraumatic.     Comments: No sinus or temporal tenderness.     Nose: Nose normal.     Mouth/Throat:     Mouth: Mucous membranes are moist.  Eyes:     General: No scleral icterus.    Extraocular Movements: Extraocular movements intact.     Conjunctiva/sclera: Conjunctivae normal.     Pupils: Pupils are equal, round, and reactive to light.  Neck:     Vascular: No carotid bruit.     Trachea: No tracheal deviation.     Comments: No stiffness or rigidity.  Cardiovascular:     Rate and Rhythm: Normal rate and regular rhythm.     Pulses: Normal pulses.     Heart sounds: Normal heart sounds. No murmur heard.    No friction  rub. No gallop.  Pulmonary:     Effort: Pulmonary effort is normal. No respiratory distress.     Breath sounds: Normal breath sounds.  Abdominal:     General: Bowel sounds are normal. There is no distension.     Palpations: Abdomen is soft.     Tenderness: There is no abdominal tenderness. There is no guarding.  Genitourinary:    Comments: No cva tenderness.  Musculoskeletal:        General: No swelling or tenderness.     Cervical back: Normal range of motion and neck supple. No rigidity. No muscular tenderness.  Skin:    General: Skin is warm and dry.     Findings: No rash.  Neurological:     Mental Status: She is alert.     Cranial Nerves: No cranial nerve deficit.     Comments: Alert, speech normal, not gross dysarthric or aphasic. Motor fxn intact bil, stre 5/5. ?altered sensation/paresthesias on left.   Psychiatric:        Mood and Affect: Mood normal.     ED Results / Procedures / Treatments   Labs (all labs ordered are listed, but only abnormal results are displayed) Results for orders placed or performed during the hospital encounter of 11/29/21  Ethanol  Result Value Ref Range   Alcohol, Ethyl (B) <10 <10 mg/dL  Protime-INR  Result Value Ref Range   Prothrombin Time 13.4 11.4 - 15.2 seconds   INR 1.0 0.8 - 1.2  APTT  Result Value Ref Range   aPTT 27 24 - 36 seconds  CBC  Result Value Ref Range   WBC 6.2 4.0 - 10.5 K/uL   RBC 4.53 3.87 - 5.11 MIL/uL   Hemoglobin 12.3 12.0 - 15.0 g/dL   HCT 16.1 09.6 - 04.5 %   MCV 82.8 80.0 - 100.0 fL   MCH 27.2 26.0 - 34.0 pg   MCHC 32.8 30.0 - 36.0 g/dL   RDW 40.9 81.1 - 91.4 %   Platelets 193 150 - 400 K/uL   nRBC 0.0 0.0 - 0.2 %  Differential  Result Value Ref Range   Neutrophils Relative % 43 %   Neutro Abs 2.7 1.7 - 7.7 K/uL   Lymphocytes Relative 44 %   Lymphs Abs 2.7 0.7 - 4.0 K/uL   Monocytes Relative 8 %   Monocytes Absolute 0.5 0.1 - 1.0 K/uL   Eosinophils  Relative 4 %   Eosinophils Absolute 0.3 0.0 - 0.5  K/uL   Basophils Relative 1 %   Basophils Absolute 0.1 0.0 - 0.1 K/uL   Immature Granulocytes 0 %   Abs Immature Granulocytes 0.02 0.00 - 0.07 K/uL  Comprehensive metabolic panel  Result Value Ref Range   Sodium 135 135 - 145 mmol/L   Potassium 5.5 (H) 3.5 - 5.1 mmol/L   Chloride 98 98 - 111 mmol/L   CO2 27 22 - 32 mmol/L   Glucose, Bld 90 70 - 99 mg/dL   BUN 12 8 - 23 mg/dL   Creatinine, Ser 2.50 0.44 - 1.00 mg/dL   Calcium 9.7 8.9 - 03.7 mg/dL   Total Protein 7.3 6.5 - 8.1 g/dL   Albumin 4.1 3.5 - 5.0 g/dL   AST 32 15 - 41 U/L   ALT 14 0 - 44 U/L   Alkaline Phosphatase 129 (H) 38 - 126 U/L   Total Bilirubin 0.5 0.3 - 1.2 mg/dL   GFR, Estimated >04 >88 mL/min   Anion gap 10 5 - 15  Urine rapid drug screen (hosp performed)  Result Value Ref Range   Opiates NONE DETECTED NONE DETECTED   Cocaine NONE DETECTED NONE DETECTED   Benzodiazepines NONE DETECTED NONE DETECTED   Amphetamines NONE DETECTED NONE DETECTED   Tetrahydrocannabinol NONE DETECTED NONE DETECTED   Barbiturates NONE DETECTED NONE DETECTED  Urinalysis, Routine w reflex microscopic  Result Value Ref Range   Color, Urine STRAW (A) YELLOW   APPearance CLEAR CLEAR   Specific Gravity, Urine 1.008 1.005 - 1.030   pH 8.0 5.0 - 8.0   Glucose, UA NEGATIVE NEGATIVE mg/dL   Hgb urine dipstick SMALL (A) NEGATIVE   Bilirubin Urine NEGATIVE NEGATIVE   Ketones, ur NEGATIVE NEGATIVE mg/dL   Protein, ur NEGATIVE NEGATIVE mg/dL   Nitrite NEGATIVE NEGATIVE   Leukocytes,Ua NEGATIVE NEGATIVE   RBC / HPF 0-5 0 - 5 RBC/hpf   WBC, UA 0-5 0 - 5 WBC/hpf   Bacteria, UA NONE SEEN NONE SEEN   Squamous Epithelial / LPF 0-5 0 - 5  CBG monitoring, ED  Result Value Ref Range   Glucose-Capillary 88 70 - 99 mg/dL  I-stat chem 8, ED  Result Value Ref Range   Sodium 134 (L) 135 - 145 mmol/L   Potassium 5.4 (H) 3.5 - 5.1 mmol/L   Chloride 98 98 - 111 mmol/L   BUN 14 8 - 23 mg/dL   Creatinine, Ser 8.91 0.44 - 1.00 mg/dL   Glucose, Bld  88 70 - 99 mg/dL   Calcium, Ion 6.94 (L) 1.15 - 1.40 mmol/L   TCO2 29 22 - 32 mmol/L   Hemoglobin 13.3 12.0 - 15.0 g/dL   HCT 50.3 88.8 - 28.0 %   CT ANGIO HEAD NECK W WO CM (CODE STROKE)  Result Date: 11/29/2021 CLINICAL DATA:  Initial evaluation for neuro deficit, stroke suspected. EXAM: CT ANGIOGRAPHY HEAD AND NECK TECHNIQUE: Multidetector CT imaging of the head and neck was performed using the standard protocol during bolus administration of intravenous contrast. Multiplanar CT image reconstructions and MIPs were obtained to evaluate the vascular anatomy. Carotid stenosis measurements (when applicable) are obtained utilizing NASCET criteria, using the distal internal carotid diameter as the denominator. RADIATION DOSE REDUCTION: This exam was performed according to the departmental dose-optimization program which includes automated exposure control, adjustment of the mA and/or kV according to patient size and/or use of iterative reconstruction technique. CONTRAST:  51mL OMNIPAQUE IOHEXOL 350  MG/ML SOLN COMPARISON:  Head CT from earlier the same day. FINDINGS: CTA NECK FINDINGS Aortic arch: Visualized aortic arch somewhat diffusely ectatic without frank aneurysm. Normal 3 vessel branching pattern. Mild for age atheromatous disease about the arch itself. No high-grade stenosis about the origin the great vessels. Right carotid system: Right common and internal carotid arteries are diffusely tortuous without dissection or occlusion. Mild for age atheromatous change about the right carotid bulb without significant stenosis. Left carotid system: Left common and internal carotid arteries are diffusely tortuous without dissection or occlusion. Mild for age atheromatous change about the left carotid bulb without significant stenosis. Vertebral arteries: Both vertebral arteries arise from subclavian arteries. No proximal subclavian artery stenosis. Left vertebral artery slightly dominant. Vertebral arteries  tortuous but widely patent without dissection or occlusion. 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 Skeleton: No discrete or worrisome osseous lesions. Moderate cervical spondylosis, most pronounced at C5-6. Degenerative changes noted about the right TMJ. Other neck: No other acute soft tissue abnormality within the neck. Upper chest: Left-sided pacemaker/AICD. Scattered subpleural fibrotic changes noted within the visualized lungs. Visualized upper chest demonstrates no other acute finding. Review of the MIP images confirms the above findings CTA HEAD FINDINGS Anterior circulation: Petrous segments patent bilaterally. Minimal for age plaque within the carotid siphons without stenosis. A1 segments patent bilaterally. Right A1 hypoplastic. Normal anterior communicating artery complex. Anterior cerebral arteries patent without stenosis. No M1 stenosis or occlusion. No proximal MCA branch occlusion. Distal MCA branches perfused and symmetric. Posterior circulation: Both vertebral arteries patent without stenosis. Both PICA patent. Basilar patent without stenosis. Superior cerebellar and posterior cerebral arteries patent bilaterally. Venous sinuses: Patent allowing for timing the contrast bolus. Anatomic variants: As above.  No aneurysm. Review of the MIP images confirms the above findings IMPRESSION: 1. Negative CTA for large vessel occlusion or other emergent finding. 2. Mild for age atheromatous change about the carotid bifurcations and carotid siphons without hemodynamically significant stenosis. 3. Diffuse tortuosity of the major arterial vasculature of the head and neck, suggesting chronic underlying hypertension. Electronically Signed   By: Jeannine Boga M.D.   On: 11/29/2021 21:07   CT HEAD CODE STROKE WO CONTRAST  Result Date: 11/29/2021 CLINICAL DATA:  Code stroke.  Initial evaluation for acute stroke. EXAM: CT HEAD WITHOUT CONTRAST TECHNIQUE: Contiguous axial images were  obtained from the base of the skull through the vertex without intravenous contrast. RADIATION DOSE REDUCTION: This exam was performed according to the departmental dose-optimization program which includes automated exposure control, adjustment of the mA and/or kV according to patient size and/or use of iterative reconstruction technique. COMPARISON:  Prior CT from 08/24/2021. 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 No acute intracranial hemorrhage. No visible acute large vessel territory infarct. No mass lesion, midline shift or mass effect. Stable ventricular size without hydrocephalus. No extra-axial fluid collection. Vascular: No hyperdense vessel. Scattered vascular calcifications noted within the carotid siphons. Skull: Scalp soft tissues and calvarium within normal limits. Sinuses/Orbits: Globes orbital soft tissues demonstrate no acute finding. Paranasal sinuses are largely clear. No mastoid effusion. Other: None. ASPECTS [facility] Stroke Program Early CT Score) - Ganglionic level infarction (caudate, lentiform nuclei, internal capsule, insula, M1-M3 cortex): 7 - Supraganglionic infarction (M4-M6 cortex): 3 Total score (0-10 with 10 being normal): 10 IMPRESSION: 1. No acute intracranial abnormality. 2. ASPECTS is 10. 3. 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 These results were communicated to Dr. Rory Percy at 7:03  pm on 11/29/2021 by text page via the [facility] messaging system. Electronically Signed   By: Rise Mu M.D.   On: 11/29/2021 19:05     EKG EKG Interpretation  Date/Time:  Thursday November 29 2021 19:20:15 EDT Ventricular Rate:  79 PR Interval:  245 QRS Duration: 83 QT Interval:  370 QTC Calculation: 425 R Axis:   8 Text Interpretation: Sinus rhythm Prolonged PR interval Confirmed by Cathren Laine ([id number]) on 11/29/2021 7:32:43 PM  Radiology CT ANGIO HEAD NECK W WO CM  (CODE STROKE)  Result Date: 11/29/2021 CLINICAL DATA:  Initial evaluation for neuro deficit, stroke suspected. EXAM: CT ANGIOGRAPHY HEAD AND NECK TECHNIQUE: Multidetector CT imaging of the head and neck was performed using the standard protocol during bolus administration of intravenous contrast. Multiplanar CT image reconstructions and MIPs were obtained to evaluate the vascular anatomy. Carotid stenosis measurements (when applicable) are obtained utilizing NASCET criteria, using the distal internal carotid diameter as the denominator. RADIATION DOSE REDUCTION: This exam was performed according to the departmental dose-optimization program which includes automated exposure control, adjustment of the mA and/or kV according to patient size and/or use of iterative reconstruction technique. CONTRAST:  49mL OMNIPAQUE IOHEXOL 350 MG/ML SOLN COMPARISON:  Head CT from earlier the same day. FINDINGS: CTA NECK FINDINGS Aortic arch: Visualized aortic arch somewhat diffusely ectatic without frank aneurysm. Normal 3 vessel branching pattern. Mild for age atheromatous disease about the arch itself. No high-grade stenosis about the origin the great vessels. Right carotid system: Right common and internal carotid arteries are diffusely tortuous without dissection or occlusion. Mild for age atheromatous change about the right carotid bulb without significant stenosis. Left carotid system: Left common and internal carotid arteries are diffusely tortuous without dissection or occlusion. Mild for age atheromatous change about the left carotid bulb without significant stenosis. Vertebral arteries: Both vertebral arteries arise from subclavian arteries. No proximal subclavian artery stenosis. Left vertebral artery slightly dominant. Vertebral arteries tortuous but widely patent without dissection or occlusion. 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 Skeleton: No discrete or worrisome osseous lesions. Moderate  cervical spondylosis, most pronounced at C5-6. Degenerative changes noted about the right TMJ. Other neck: No other acute soft tissue abnormality within the neck. Upper chest: Left-sided pacemaker/AICD. Scattered subpleural fibrotic changes noted within the visualized lungs. Visualized upper chest demonstrates no other acute finding. Review of the MIP images confirms the above findings CTA HEAD FINDINGS Anterior circulation: Petrous segments patent bilaterally. Minimal for age plaque within the carotid siphons without stenosis. A1 segments patent bilaterally. Right A1 hypoplastic. Normal anterior communicating artery complex. Anterior cerebral arteries patent without stenosis. No M1 stenosis or occlusion. No proximal MCA branch occlusion. Distal MCA branches perfused and symmetric. Posterior circulation: Both vertebral arteries patent without stenosis. Both PICA patent. Basilar patent without stenosis. Superior cerebellar and posterior cerebral arteries patent bilaterally. Venous sinuses: Patent allowing for timing the contrast bolus. Anatomic variants: As above.  No aneurysm. Review of the MIP images confirms the above findings IMPRESSION: 1. Negative CTA for large vessel occlusion or other emergent finding. 2. Mild for age atheromatous change about the carotid bifurcations and carotid siphons without hemodynamically significant stenosis. 3. Diffuse tortuosity of the major arterial vasculature of the head and neck, suggesting chronic underlying hypertension. Electronically Signed   By: Rise Mu M.D.   On: 11/29/2021 21:07   CT HEAD CODE STROKE WO CONTRAST  Result Date: 11/29/2021 CLINICAL DATA:  Code stroke.  Initial evaluation for acute stroke. EXAM: CT HEAD WITHOUT  CONTRAST TECHNIQUE: Contiguous axial images were obtained from the base of the skull through the vertex without intravenous contrast. RADIATION DOSE REDUCTION: This exam was performed according to the departmental dose-optimization  program which includes automated exposure control, adjustment of the mA and/or kV according to patient size and/or use of iterative reconstruction technique. COMPARISON:  Prior CT from 08/24/2021. 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 No acute intracranial hemorrhage. No visible acute large vessel territory infarct. No mass lesion, midline shift or mass effect. Stable ventricular size without hydrocephalus. No extra-axial fluid collection. Vascular: No hyperdense vessel. Scattered vascular calcifications noted within the carotid siphons. Skull: Scalp soft tissues and calvarium within normal limits. Sinuses/Orbits: Globes orbital soft tissues demonstrate no acute finding. Paranasal sinuses are largely clear. No mastoid effusion. Other: None. ASPECTS [facility] Stroke Program Early CT Score) - Ganglionic level infarction (caudate, lentiform nuclei, internal capsule, insula, M1-M3 cortex): 7 - Supraganglionic infarction (M4-M6 cortex): 3 Total score (0-10 with 10 being normal): 10 IMPRESSION: 1. No acute intracranial abnormality. 2. ASPECTS is 10. 3. 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 These results were communicated to Dr. Wilford Corner at 7:03 pm on 11/29/2021 by text page via the [facility] messaging system. Electronically Signed   By: Rise Mu M.D.   On: 11/29/2021 19:05    Procedures Procedures    Medications Ordered in ED Medications  sodium chloride 0.9 % bolus 500 mL (has no administration in time range)  iohexol (OMNIPAQUE) 350 MG/ML injection 60 mL (60 mLs Intravenous Contrast Given 11/29/21 1916)  sodium chloride 0.9 % bolus 1,000 mL (1,000 mLs Intravenous New Bag/Given 11/29/21 1943)  prochlorperazine (COMPAZINE) injection 10 mg (10 mg Intravenous Given 11/29/21 2043)    ED Course/ Medical Decision Making/ A&P                           Medical Decision Making Problems  Addressed: Essential hypertension: chronic illness or injury that poses a threat to life or bodily functions Left sided numbness: acute illness or injury with systemic symptoms that poses a threat to life or bodily functions Mild dementia without behavioral disturbance, psychotic disturbance, mood disturbance, or anxiety, unspecified dementia type (HCC): chronic illness or injury Paresthesia: acute illness or injury with systemic symptoms that poses a threat to life or bodily functions  Amount and/or Complexity of Data Reviewed Independent Historian: EMS    Details: hx External Data Reviewed: notes. Labs: ordered. Decision-making details documented in ED Course. Radiology: ordered and independent interpretation performed. Decision-making details documented in ED Course. ECG/medicine tests: ordered and independent interpretation performed. Decision-making details documented in ED Course. Discussion of management or test interpretation with external provider(s): Neurology, medicine  Risk Prescription drug management. Decision regarding hospitalization.   Iv ns. Continuous pulse ox and cardiac monitoring. Labs ordered/sent. Imaging ordered.   Pt arrives as code stroke activation. Emergent ct and emergent neurology eval ordered.   Diff dx includes acute cva, ich, uti, etc - dispo decision including potential need for admission considered.   Reviewed nursing notes and prior charts for additional history. External reports reviewed. Additional history from:  Cardiac monitor: sinus rhythm, rate 74.  Labs reviewed/interpreted by me - wbc and hgb normal. Ua neg for uti.   CT reviewed/interpreted by me -  no hem. No LVO.  Recheck exam, no change from prior.   Neurology consulted noted/appreciated. Will admit for obs, neurochecks, MRI tomorrow.  Medicine consulted for admission.   CRITICAL CARE RE: code stroke activation, acute neurologic symptoms.  Performed by: Suzi Roots Total  critical care time: 40 minutes Critical care time was exclusive of separately billable procedures and treating other patients. Critical care was necessary to treat or prevent imminent or life-threatening deterioration. Critical care was time spent personally by me on the following activities: development of treatment plan with patient and/or surrogate as well as nursing, discussions with consultants, evaluation of patient's response to treatment, examination of patient, obtaining history from patient or surrogate, ordering and performing treatments and interventions, ordering and review of laboratory studies, ordering and review of radiographic studies, pulse oximetry and re-evaluation of patient's condition.          Final Clinical Impression(s) / ED Diagnoses Final diagnoses:  None    Rx / DC Orders ED Discharge Orders     None         Cathren Laine, MD 11/29/21 2152

## 2021-11-30 ENCOUNTER — Observation Stay (HOSPITAL_COMMUNITY): Payer: Medicare HMO

## 2021-11-30 DIAGNOSIS — R200 Anesthesia of skin: Secondary | ICD-10-CM | POA: Diagnosis not present

## 2021-11-30 NOTE — ED Notes (Signed)
Taken to MRI by transporter and Amy,RN for monitoring needs due to pacemaker in place.

## 2021-11-30 NOTE — Progress Notes (Signed)
Reprogrammed pacer to prior settings

## 2021-11-30 NOTE — Discharge Planning (Signed)
Dulcey Riederer J. Clydene Laming, RN, BSN, [place] [phone] RNCM spoke with pt at bedside regarding discharge planning for [place]. Offered pt medicare.gov list of home health agencies to choose from.  Pt chose [facility] to render services. Kerry Dory of [facility] notified. Patient made aware that [facility] will be in contact in 24-48 hours.  No DME needs identified at this time.

## 2021-11-30 NOTE — Discharge Summary (Signed)
Physician Discharge Summary  Venita Weymer [id number] DOB: 1940-07-30 DOA: 11/29/2021  PCP: Wallene Dales, MD  Admit date: 11/29/2021 Discharge date: 11/30/2021  Admitted From: Home Disposition: Home  Recommendations for Outpatient Follow-up:  Follow up with PCP in 1 week with repeat CBC/BMP Outpatient follow-up with neurology Follow up in ED if symptoms worsen or new appear   Home Health: No Equipment/Devices: None  Discharge Condition: Stable CODE STATUS: Full Diet recommendation: Heart healthy  Brief/Interim Summary: 81 y.o. with history of bipolar, CHF, CAD, dementia, hypertension, GERD, rheumatoid arthritis, prior CVA, noncompliant with her medications presented with left-sided numbness and code stroke was called.  Neurology was consulted.  CT of the head showed no acute changes and CTA head and neck showed no emergent LVO.  During the hospitalization, her symptoms are improving.  MRI of brain was negative for acute intracranial abnormity.  Neurology thought that patient might have had complex migraine.  She has tolerated PT.  Neurology has cleared the patient for discharge.  Discharge patient home today with close outpatient follow-up with PCP and neurology.  Patient needs to be compliant with her medications.  Discharge Diagnoses:   Left-sided numbness -Imaging as above. -During the hospitalization, her symptoms are improving.  -Neurology thought that patient might have had complex migraine.  She has tolerated PT.  Neurology has cleared the patient for discharge.  Discharge patient home today with close outpatient follow-up with PCP and neurology.   Other chronic medical problems Dementia Bipolar disorder Hyperlipidemia Hypertension Prior unspecified CVA Peripheral neuropathy GERD Noncompliance  Plan -It looks like patient is noncompliant with her medications at home.  She needs to follow-up with her PCP for refill of these medications.  In the meantime, I will  continue medications listed on her MAR which she is supposed to take till she sees her PCP.  Continue Aricept, aspirin, Plavix, Lipitor, Coreg, Cymbalta and PPI.  Discharge Instructions   Allergies as of 11/30/2021       Reactions   Bee Venom Other (See Comments), Swelling   Paralysis, loss of speech, swelling.   Passed out   [employer] Other (See Comments)   Paralysis, loss of speech, swelling.     Shellfish Allergy Rash, Other (See Comments)   Shellfish-derived Products Other (See Comments), Rash   Solifenacin Other (See Comments)   Unknown.   Unknown.   Unknown.     Zonisamide Other (See Comments)   unknown unknown   Ketorolac Palpitations   Nsaids Palpitations   Tramadol Itching        Medication List     STOP taking these medications    calcium carbonate 1250 (500 Ca) MG chewable tablet Commonly known as: OS-CAL   Ensure   LORazepam 0.5 MG tablet Commonly known as: ATIVAN   megestrol 40 MG/ML suspension Commonly known as: MEGACE   potassium chloride SA 20 MEQ tablet Commonly known as: KLOR-CON M   predniSONE 20 MG tablet Commonly known as: DELTASONE   promethazine 25 MG tablet Commonly known as: PHENERGAN   spironolactone 25 MG tablet Commonly known as: ALDACTONE       TAKE these medications    albuterol 108 (90 Base) MCG/ACT inhaler Commonly known as: VENTOLIN HFA Inhale 2 puffs into the lungs every 6 (six) hours as needed for wheezing or shortness of breath.   aspirin 81 MG tablet Take 81 mg by mouth daily. What changed: Another medication with the same name was removed. Continue taking this medication, and follow the directions you  see here.   atorvastatin 40 MG tablet Commonly known as: LIPITOR Take 1 tablet (40 mg total) by mouth daily at 6 PM. What changed: Another medication with the same name was removed. Continue taking this medication, and follow the directions you see here.   b complex vitamins capsule Take 1 capsule by  mouth daily.   butalbital-acetaminophen-caffeine 50-325-40 MG tablet Commonly known as: FIORICET Take 1 tablet by mouth daily. Do not exceed 15 tablets per month.   Calcium + Vitamin D3 500-10 MG-MCG Chew Generic drug: Calcium Carb-Cholecalciferol Chew 1 tablet by mouth daily.   carvedilol 25 MG tablet Commonly known as: COREG Take 1 tablet (25 mg total) by mouth 2 (two) times daily with a meal.   clopidogrel 75 MG tablet Commonly known as: PLAVIX Take 1 tablet by mouth daily.   donepezil 10 MG tablet Commonly known as: ARICEPT Take 1 tablet (10 mg total) by mouth at bedtime.   DULoxetine 60 MG capsule Commonly known as: CYMBALTA Take 1 capsule by mouth daily. What changed: Another medication with the same name was removed. Continue taking this medication, and follow the directions you see here.   ergocalciferol 1.25 MG (50000 UT) capsule Commonly known as: VITAMIN D2 Take 1 capsule by mouth See admin instructions. Take one capsule (50,000 Units Total) by mouth once a week for 7 doses.   fluticasone-salmeterol 115-21 MCG/ACT inhaler Commonly known as: ADVAIR HFA Inhale 2 puffs into the lungs 2 (two) times daily.   furosemide 20 MG tablet Commonly known as: LASIX Take 1 tablet by mouth 2 (two) times daily.   gabapentin 300 MG capsule Commonly known as: NEURONTIN Take 1 capsule (300 mg total) by mouth 3 (three) times daily. What changed: Another medication with the same name was removed. Continue taking this medication, and follow the directions you see here.   mirabegron ER 50 MG Tb24 tablet Commonly known as: Myrbetriq Take 1 tablet (50 mg total) by mouth daily.   omeprazole 40 MG capsule Commonly known as: PRILOSEC Take 1 capsule (40 mg total) by mouth daily. What changed: Another medication with the same name was removed. Continue taking this medication, and follow the directions you see here.   traZODone 150 MG tablet Commonly known as: DESYREL Take 1 tablet  (150 mg total) by mouth at bedtime.         Follow-up Information     Asres, Alehegn, MD. Schedule an appointment as soon as possible for a visit in 1 week(s).   Specialty: Family Medicine Contact information: [address] Kristeen Mans [address] [place] [address] [phone]                Allergies  Allergen Reactions   Bee Venom Other (See Comments) and Swelling    Paralysis, loss of speech, swelling.   Passed out    [employer] Other (See Comments)    Paralysis, loss of speech, swelling.     Shellfish Allergy Rash and Other (See Comments)   Shellfish-Derived Products Other (See Comments) and Rash   Solifenacin Other (See Comments)    Unknown.   Unknown.   Unknown.      Zonisamide Other (See Comments)    unknown unknown    Ketorolac Palpitations   Nsaids Palpitations   Tramadol Itching    Consultations: Neurology   Procedures/Studies: CT ANGIO HEAD NECK W WO CM (CODE STROKE)  Result Date: 11/29/2021 CLINICAL DATA:  Initial evaluation for neuro deficit, stroke suspected. EXAM: CT ANGIOGRAPHY HEAD AND  NECK TECHNIQUE: Multidetector CT imaging of the head and neck was performed using the standard protocol during bolus administration of intravenous contrast. Multiplanar CT image reconstructions and MIPs were obtained to evaluate the vascular anatomy. Carotid stenosis measurements (when applicable) are obtained utilizing NASCET criteria, using the distal internal carotid diameter as the denominator. RADIATION DOSE REDUCTION: This exam was performed according to the departmental dose-optimization program which includes automated exposure control, adjustment of the mA and/or kV according to patient size and/or use of iterative reconstruction technique. CONTRAST:  84mL OMNIPAQUE IOHEXOL 350 MG/ML SOLN COMPARISON:  Head CT from earlier the same day. FINDINGS: CTA NECK FINDINGS Aortic arch: Visualized aortic arch somewhat diffusely ectatic without frank aneurysm.  Normal 3 vessel branching pattern. Mild for age atheromatous disease about the arch itself. No high-grade stenosis about the origin the great vessels. Right carotid system: Right common and internal carotid arteries are diffusely tortuous without dissection or occlusion. Mild for age atheromatous change about the right carotid bulb without significant stenosis. Left carotid system: Left common and internal carotid arteries are diffusely tortuous without dissection or occlusion. Mild for age atheromatous change about the left carotid bulb without significant stenosis. Vertebral arteries: Both vertebral arteries arise from subclavian arteries. No proximal subclavian artery stenosis. Left vertebral artery slightly dominant. Vertebral arteries tortuous but widely patent without dissection or occlusion. 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 Skeleton: No discrete or worrisome osseous lesions. Moderate cervical spondylosis, most pronounced at C5-6. Degenerative changes noted about the right TMJ. Other neck: No other acute soft tissue abnormality within the neck. Upper chest: Left-sided pacemaker/AICD. Scattered subpleural fibrotic changes noted within the visualized lungs. Visualized upper chest demonstrates no other acute finding. Review of the MIP images confirms the above findings CTA HEAD FINDINGS Anterior circulation: Petrous segments patent bilaterally. Minimal for age plaque within the carotid siphons without stenosis. A1 segments patent bilaterally. Right A1 hypoplastic. Normal anterior communicating artery complex. Anterior cerebral arteries patent without stenosis. No M1 stenosis or occlusion. No proximal MCA branch occlusion. Distal MCA branches perfused and symmetric. Posterior circulation: Both vertebral arteries patent without stenosis. Both PICA patent. Basilar patent without stenosis. Superior cerebellar and posterior cerebral arteries patent bilaterally. Venous sinuses: Patent  allowing for timing the contrast bolus. Anatomic variants: As above.  No aneurysm. Review of the MIP images confirms the above findings IMPRESSION: 1. Negative CTA for large vessel occlusion or other emergent finding. 2. Mild for age atheromatous change about the carotid bifurcations and carotid siphons without hemodynamically significant stenosis. 3. Diffuse tortuosity of the major arterial vasculature of the head and neck, suggesting chronic underlying hypertension. Electronically Signed   By: Jeannine Boga M.D.   On: 11/29/2021 21:07   CT HEAD CODE STROKE WO CONTRAST  Result Date: 11/29/2021 CLINICAL DATA:  Code stroke.  Initial evaluation for acute stroke. EXAM: CT HEAD WITHOUT CONTRAST TECHNIQUE: Contiguous axial images were obtained from the base of the skull through the vertex without intravenous contrast. RADIATION DOSE REDUCTION: This exam was performed according to the departmental dose-optimization program which includes automated exposure control, adjustment of the mA and/or kV according to patient size and/or use of iterative reconstruction technique. COMPARISON:  Prior CT from 08/24/2021. 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 No acute intracranial hemorrhage. No visible acute large vessel territory infarct. No mass lesion, midline shift or mass effect. Stable ventricular size without hydrocephalus. No extra-axial fluid collection. Vascular: No hyperdense vessel. Scattered vascular calcifications noted within the carotid  siphons. Skull: Scalp soft tissues and calvarium within normal limits. Sinuses/Orbits: Globes orbital soft tissues demonstrate no acute finding. Paranasal sinuses are largely clear. No mastoid effusion. Other: None. ASPECTS [facility] Stroke Program Early CT Score) - Ganglionic level infarction (caudate, lentiform nuclei, internal capsule, insula, M1-M3 cortex): 7 - Supraganglionic infarction (M4-M6 cortex): 3  Total score (0-10 with 10 being normal): 10 IMPRESSION: 1. No acute intracranial abnormality. 2. ASPECTS is 10. 3. 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 These results were communicated to Dr. Rory Percy at 7:03 pm on 11/29/2021 by text page via the [facility] messaging system. Electronically Signed   By: Jeannine Boga M.D.   On: 11/29/2021 19:05      Subjective: Patient seen and examined at bedside.  Poor historian.  No fever, vomiting, seizures reported.  Discharge Exam: Vitals:   11/30/21 0930 11/30/21 1015  BP: 112/89 126/70  Pulse: 73 72  Resp: (!) 24 19  Temp:    SpO2: 100% 100%    General: Pt is alert, awake, not in acute distress.  Elderly female lying in bed.  Poor historian.  Slow to respond. Cardiovascular: rate controlled, S1/S2 + Respiratory: bilateral decreased breath sounds at bases with some scattered crackles and intermittent tachypnea Abdominal: Soft, NT, ND, bowel sounds + Extremities: no edema, no cyanosis    The results of significant diagnostics from this hospitalization (including imaging, microbiology, ancillary and laboratory) are listed below for reference.     Microbiology: No results found for this or any previous visit (from the past 240 hour(s)).   Labs: BNP (last 3 results) No results for input(s): "BNP" in the last 8760 hours. Basic Metabolic Panel: Recent Labs  Lab 11/29/21 1854 11/29/21 1904 11/29/21 2245  NA 135 134*  --   K 5.5* 5.4*  --   CL 98 98  --   CO2 27  --   --   GLUCOSE 90 88  --   BUN 12 14  --   CREATININE 0.84 0.90 0.75  CALCIUM 9.7  --   --    Liver Function Tests: Recent Labs  Lab 11/29/21 1854  AST 32  ALT 14  ALKPHOS 129*  BILITOT 0.5  PROT 7.3  ALBUMIN 4.1   No results for input(s): "LIPASE", "AMYLASE" in the last 168 hours. No results for input(s): "AMMONIA" in the last 168 hours. CBC: Recent Labs  Lab 11/29/21 1854 11/29/21 1904 11/29/21 2245  WBC 6.2   --  5.3  NEUTROABS 2.7  --   --   HGB 12.3 13.3 11.1*  HCT 37.5 39.0 33.6*  MCV 82.8  --  83.8  PLT 193  --  136*   Cardiac Enzymes: No results for input(s): "CKTOTAL", "CKMB", "CKMBINDEX", "TROPONINI" in the last 168 hours. BNP: Invalid input(s): "POCBNP" CBG: Recent Labs  Lab 11/29/21 1850  GLUCAP 88   D-Dimer No results for input(s): "DDIMER" in the last 72 hours. Hgb A1c No results for input(s): "HGBA1C" in the last 72 hours. Lipid Profile No results for input(s): "CHOL", "HDL", "LDLCALC", "TRIG", "CHOLHDL", "LDLDIRECT" in the last 72 hours. Thyroid function studies No results for input(s): "TSH", "T4TOTAL", "T3FREE", "THYROIDAB" in the last 72 hours.  Invalid input(s): "FREET3" Anemia work up No results for input(s): "VITAMINB12", "FOLATE", "FERRITIN", "TIBC", "IRON", "RETICCTPCT" in the last 72 hours. Urinalysis    Component Value Date/Time   COLORURINE STRAW (A) 11/29/2021 [place] 11/29/2021 1854   LABSPEC 1.008 11/29/2021 1854  PHURINE 8.0 11/29/2021 1854   GLUCOSEU NEGATIVE 11/29/2021 1854   HGBUR SMALL (A) 11/29/2021 [place] 11/29/2021 [place] 11/29/2021 [place] 11/29/2021 1854   UROBILINOGEN 0.2 10/07/2014 0305   NITRITE NEGATIVE 11/29/2021 [place] 11/29/2021 1854   Sepsis Labs Recent Labs  Lab 11/29/21 1854 11/29/21 2245  WBC 6.2 5.3   Microbiology No results found for this or any previous visit (from the past 240 hour(s)).   Time coordinating discharge: 35 minutes  SIGNED:   Aline August, MD  [facility] 11/30/2021, 10:59 AM

## 2021-11-30 NOTE — Evaluation (Signed)
Physical Therapy Evaluation Patient Details Name: Joyce Mathews MRN: [id number] DOB: 06-10-1940 Today's Date: 11/30/2021  History of Present Illness  Pt is an 81 y/o female admitted secondary to L numbness and dizziness. MRI negative. PMH includes CHF, bipolar, CAD, dementia, HTN, RA, and CVA.  Clinical Impression  Pt admitted secondary to problem above with deficits below. Pt requiring min guard to min A for mobility tasks this session. Pt with notable cognitive deficits, but has hx of dementia. Discussed needing assist from family and pt reports daughter can assist. Will need to ensure daughter can stay with pt and assist as needed. If family can provide necessary assist, recommend HHPT. Will continue to follow acutely.        Recommendations for follow up therapy are one component of a multi-disciplinary discharge planning process, led by the attending physician.  Recommendations may be updated based on patient status, additional functional criteria and insurance authorization.  Follow Up Recommendations Home health PT (as long as family can provide necessary assist)      Assistance Recommended at Discharge Frequent or constant Supervision/Assistance  Patient can return home with the following  A little help with walking and/or transfers;A little help with bathing/dressing/bathroom;Assistance with cooking/housework;Help with stairs or ramp for entrance;Assist for transportation;Direct supervision/assist for medications management    Equipment Recommendations None recommended by PT  Recommendations for Other Services       Functional Status Assessment Patient has had a recent decline in their functional status and demonstrates the ability to make significant improvements in function in a reasonable and predictable amount of time.     Precautions / Restrictions Precautions Precautions: Fall Restrictions Weight Bearing Restrictions: No      Mobility  Bed Mobility Overal bed  mobility: Needs Assistance Bed Mobility: Supine to Sit, Sit to Supine     Supine to sit: Min assist Sit to supine: Supervision   General bed mobility comments: Min A for trunk elevation to come to sitting. Pt very fixated on gown and bed being wet and required cues for redirection to task.    Transfers Overall transfer level: Needs assistance Equipment used: 1 person hand held assist Transfers: Sit to/from Stand, Bed to chair/wheelchair/BSC Sit to Stand: Min assist Stand pivot transfers: Min guard         General transfer comment: Min A for lift assist and steadying to stand. Min guard A to take steps to chair for clean up and changing linens.    Ambulation/Gait                  Stairs            Wheelchair Mobility    Modified Rankin (Stroke Patients Only)       Balance Overall balance assessment: Needs assistance Sitting-balance support: No upper extremity supported, Feet supported Sitting balance-Leahy Scale: Fair     Standing balance support: Single extremity supported Standing balance-Leahy Scale: Poor Standing balance comment: Reliant on UE and external support                             Pertinent Vitals/Pain Pain Assessment Pain Assessment: No/denies pain    Home Living Family/patient expects to be discharged to:: Private residence Living Arrangements: Children Available Help at Discharge: Family Type of Home: House Home Access: Stairs to enter Entrance Stairs-Rails: None Entrance Stairs-Number of Steps: 1   Home Layout: One level Home Equipment: Rollator (4 wheels);Rolling Walker (2 wheels);Cane -  single point Additional Comments: Daughter stays with her 3 days/week    Prior Function Prior Level of Function : Needs assist             Mobility Comments: Uses walker vs cane for mobility ADLs Comments: Daughter assists with getting into the shower     Hand Dominance        Extremity/Trunk Assessment   Upper  Extremity Assessment Upper Extremity Assessment: Generalized weakness    Lower Extremity Assessment Lower Extremity Assessment: Generalized weakness    Cervical / Trunk Assessment Cervical / Trunk Assessment: Normal  Communication   Communication: No difficulties  Cognition Arousal/Alertness: Awake/alert Behavior During Therapy: WFL for tasks assessed/performed Overall Cognitive Status: History of cognitive impairments - at baseline                                 General Comments: Hx of dementia. Was laying in urine, but had not told anyone.        General Comments General comments (skin integrity, edema, etc.): No family present.    Exercises     Assessment/Plan    PT Assessment Patient needs continued PT services  PT Problem List Decreased strength;Decreased activity tolerance;Decreased mobility;Decreased balance;Decreased cognition;Decreased safety awareness;Decreased knowledge of use of DME;Decreased knowledge of precautions       PT Treatment Interventions DME instruction;Gait training;Therapeutic activities;Therapeutic exercise;Functional mobility training;Balance training;Patient/family education    PT Goals (Current goals can be found in the Care Plan section)  Acute Rehab PT Goals Patient Stated Goal: to go home PT Goal Formulation: With patient Time For Goal Achievement: 12/14/21 Potential to Achieve Goals: Good    Frequency Min 3X/week     Co-evaluation               AM-PAC PT "6 Clicks" Mobility  Outcome Measure Help needed turning from your back to your side while in a flat bed without using bedrails?: A Little Help needed moving from lying on your back to sitting on the side of a flat bed without using bedrails?: A Little Help needed moving to and from a bed to a chair (including a wheelchair)?: A Little Help needed standing up from a chair using your arms (e.g., wheelchair or bedside chair)?: A Little Help needed to walk in  hospital room?: A Little Help needed climbing 3-5 steps with a railing? : A Lot 6 Click Score: 17    End of Session Equipment Utilized During Treatment: Gait belt Activity Tolerance: Patient tolerated treatment well Patient left: in bed;with call bell/phone within reach (on stretcher in ED) Nurse Communication: Mobility status PT Visit Diagnosis: Unsteadiness on feet (R26.81);Muscle weakness (generalized) (M62.81);Difficulty in walking, not elsewhere classified (R26.2)    Time: 7253-6644 PT Time Calculation (min) (ACUTE ONLY): 23 min   Charges:   PT Evaluation $PT Eval Moderate Complexity: 1 Mod PT Treatments $Therapeutic Activity: 8-22 mins        Cindee Salt, DPT  Acute Rehabilitation Services  Office: [phone]   [facility] 11/30/2021, 2:58 PM

## 2021-11-30 NOTE — Progress Notes (Signed)
PT Cancellation Note  Patient Details Name: Joyce Mathews MRN: [id number] DOB: Aug 21, 1940   Cancelled Treatment:    Reason Eval/Treat Not Completed: Patient at procedure or test/unavailable Pt to MRI. Will follow up as schedule allows.   Lou Miner, DPT  [facility]  Office: [phone]    Rudean Hitt 11/30/2021, 11:58 AM

## 2022-11-17 DIAGNOSIS — R531 Weakness: Secondary | ICD-10-CM | POA: Diagnosis not present

## 2022-11-17 DIAGNOSIS — R059 Cough, unspecified: Secondary | ICD-10-CM | POA: Diagnosis not present

## 2022-11-17 DIAGNOSIS — R4182 Altered mental status, unspecified: Secondary | ICD-10-CM | POA: Diagnosis not present

## 2022-11-17 DIAGNOSIS — F319 Bipolar disorder, unspecified: Secondary | ICD-10-CM | POA: Diagnosis not present

## 2022-11-17 DIAGNOSIS — R0989 Other specified symptoms and signs involving the circulatory and respiratory systems: Secondary | ICD-10-CM | POA: Diagnosis not present

## 2022-11-17 DIAGNOSIS — R457 State of emotional shock and stress, unspecified: Secondary | ICD-10-CM | POA: Diagnosis not present

## 2022-11-17 DIAGNOSIS — M069 Rheumatoid arthritis, unspecified: Secondary | ICD-10-CM | POA: Diagnosis not present

## 2022-11-17 DIAGNOSIS — R42 Dizziness and giddiness: Secondary | ICD-10-CM | POA: Diagnosis not present

## 2022-11-17 DIAGNOSIS — Z950 Presence of cardiac pacemaker: Secondary | ICD-10-CM | POA: Diagnosis not present

## 2022-11-17 DIAGNOSIS — I10 Essential (primary) hypertension: Secondary | ICD-10-CM | POA: Diagnosis not present

## 2022-11-17 DIAGNOSIS — I509 Heart failure, unspecified: Secondary | ICD-10-CM | POA: Diagnosis not present

## 2022-11-17 DIAGNOSIS — I440 Atrioventricular block, first degree: Secondary | ICD-10-CM | POA: Diagnosis not present

## 2022-11-17 DIAGNOSIS — R918 Other nonspecific abnormal finding of lung field: Secondary | ICD-10-CM | POA: Diagnosis not present

## 2022-11-18 DIAGNOSIS — I440 Atrioventricular block, first degree: Secondary | ICD-10-CM | POA: Diagnosis not present

## 2023-03-10 DIAGNOSIS — R200 Anesthesia of skin: Secondary | ICD-10-CM | POA: Diagnosis not present

## 2023-03-10 DIAGNOSIS — R531 Weakness: Secondary | ICD-10-CM | POA: Diagnosis not present

## 2023-03-10 DIAGNOSIS — I672 Cerebral atherosclerosis: Secondary | ICD-10-CM | POA: Diagnosis not present

## 2023-03-10 DIAGNOSIS — D649 Anemia, unspecified: Secondary | ICD-10-CM | POA: Diagnosis not present

## 2023-03-10 DIAGNOSIS — E782 Mixed hyperlipidemia: Secondary | ICD-10-CM | POA: Diagnosis not present

## 2023-03-10 DIAGNOSIS — F0153 Vascular dementia, unspecified severity, with mood disturbance: Secondary | ICD-10-CM | POA: Diagnosis not present

## 2023-03-10 DIAGNOSIS — I700 Atherosclerosis of aorta: Secondary | ICD-10-CM | POA: Diagnosis not present

## 2023-03-10 DIAGNOSIS — E871 Hypo-osmolality and hyponatremia: Secondary | ICD-10-CM | POA: Diagnosis not present

## 2023-03-10 DIAGNOSIS — G319 Degenerative disease of nervous system, unspecified: Secondary | ICD-10-CM | POA: Diagnosis not present

## 2023-03-10 DIAGNOSIS — F319 Bipolar disorder, unspecified: Secondary | ICD-10-CM | POA: Diagnosis not present

## 2023-03-10 DIAGNOSIS — M79643 Pain in unspecified hand: Secondary | ICD-10-CM | POA: Diagnosis not present

## 2023-03-10 DIAGNOSIS — M542 Cervicalgia: Secondary | ICD-10-CM | POA: Diagnosis not present

## 2023-03-10 DIAGNOSIS — R42 Dizziness and giddiness: Secondary | ICD-10-CM | POA: Diagnosis not present

## 2023-03-10 DIAGNOSIS — R457 State of emotional shock and stress, unspecified: Secondary | ICD-10-CM | POA: Diagnosis not present

## 2023-03-10 DIAGNOSIS — I10 Essential (primary) hypertension: Secondary | ICD-10-CM | POA: Diagnosis not present

## 2023-03-10 DIAGNOSIS — R2990 Unspecified symptoms and signs involving the nervous system: Secondary | ICD-10-CM | POA: Diagnosis not present

## 2023-03-10 DIAGNOSIS — R519 Headache, unspecified: Secondary | ICD-10-CM | POA: Diagnosis not present

## 2023-03-10 DIAGNOSIS — R079 Chest pain, unspecified: Secondary | ICD-10-CM | POA: Diagnosis not present

## 2023-03-10 DIAGNOSIS — R29898 Other symptoms and signs involving the musculoskeletal system: Secondary | ICD-10-CM | POA: Diagnosis not present

## 2023-03-11 DIAGNOSIS — R2990 Unspecified symptoms and signs involving the nervous system: Secondary | ICD-10-CM | POA: Diagnosis not present

## 2023-03-11 DIAGNOSIS — I6782 Cerebral ischemia: Secondary | ICD-10-CM | POA: Diagnosis not present

## 2023-03-11 DIAGNOSIS — R519 Headache, unspecified: Secondary | ICD-10-CM | POA: Diagnosis not present

## 2023-03-11 DIAGNOSIS — G319 Degenerative disease of nervous system, unspecified: Secondary | ICD-10-CM | POA: Diagnosis not present

## 2023-03-11 DIAGNOSIS — D320 Benign neoplasm of cerebral meninges: Secondary | ICD-10-CM | POA: Diagnosis not present

## 2023-03-11 DIAGNOSIS — Z8673 Personal history of transient ischemic attack (TIA), and cerebral infarction without residual deficits: Secondary | ICD-10-CM | POA: Diagnosis not present

## 2023-07-24 DIAGNOSIS — R4781 Slurred speech: Secondary | ICD-10-CM | POA: Diagnosis not present

## 2023-07-24 DIAGNOSIS — R29898 Other symptoms and signs involving the musculoskeletal system: Secondary | ICD-10-CM | POA: Diagnosis not present

## 2023-07-24 DIAGNOSIS — R000 Tachycardia, unspecified: Secondary | ICD-10-CM | POA: Diagnosis not present

## 2023-07-24 DIAGNOSIS — R5383 Other fatigue: Secondary | ICD-10-CM | POA: Diagnosis not present

## 2023-07-24 DIAGNOSIS — Z7982 Long term (current) use of aspirin: Secondary | ICD-10-CM | POA: Diagnosis not present

## 2023-07-24 DIAGNOSIS — R531 Weakness: Secondary | ICD-10-CM | POA: Diagnosis not present

## 2023-07-24 DIAGNOSIS — R29818 Other symptoms and signs involving the nervous system: Secondary | ICD-10-CM | POA: Diagnosis not present

## 2023-07-24 DIAGNOSIS — I6523 Occlusion and stenosis of bilateral carotid arteries: Secondary | ICD-10-CM | POA: Diagnosis not present

## 2023-07-24 DIAGNOSIS — I6782 Cerebral ischemia: Secondary | ICD-10-CM | POA: Diagnosis not present

## 2023-07-24 DIAGNOSIS — Z79899 Other long term (current) drug therapy: Secondary | ICD-10-CM | POA: Diagnosis not present

## 2023-07-24 DIAGNOSIS — I639 Cerebral infarction, unspecified: Secondary | ICD-10-CM | POA: Diagnosis not present

## 2023-07-24 DIAGNOSIS — R9089 Other abnormal findings on diagnostic imaging of central nervous system: Secondary | ICD-10-CM | POA: Diagnosis not present

## 2023-07-24 DIAGNOSIS — R2990 Unspecified symptoms and signs involving the nervous system: Secondary | ICD-10-CM | POA: Diagnosis not present

## 2023-07-24 DIAGNOSIS — I10 Essential (primary) hypertension: Secondary | ICD-10-CM | POA: Diagnosis not present

## 2023-07-25 DIAGNOSIS — R29818 Other symptoms and signs involving the nervous system: Secondary | ICD-10-CM | POA: Diagnosis not present
